# Patient Record
Sex: Male | Born: 1960 | Race: White | Hispanic: No | Marital: Single | Smoking: Former smoker
Health system: Southern US, Community
[De-identification: ages and names within clinical notes are randomized; demographics above are authoritative.]

## PROBLEM LIST (undated history)

## (undated) DIAGNOSIS — I10 Essential (primary) hypertension: Secondary | ICD-10-CM

## (undated) DIAGNOSIS — I499 Cardiac arrhythmia, unspecified: Secondary | ICD-10-CM

## (undated) HISTORY — PX: JOINT REPLACEMENT: SHX530

---

## 1998-12-14 ENCOUNTER — Emergency Department (HOSPITAL_COMMUNITY): Admission: EM | Admit: 1998-12-14 | Discharge: 1998-12-14 | Payer: Self-pay | Admitting: Emergency Medicine

## 2003-01-30 ENCOUNTER — Encounter: Admission: RE | Admit: 2003-01-30 | Discharge: 2003-01-30 | Payer: Self-pay | Admitting: Sports Medicine

## 2003-02-02 ENCOUNTER — Encounter: Payer: Self-pay | Admitting: Sports Medicine

## 2003-02-02 ENCOUNTER — Encounter: Admission: RE | Admit: 2003-02-02 | Discharge: 2003-02-02 | Payer: Self-pay | Admitting: Sports Medicine

## 2003-03-03 ENCOUNTER — Encounter: Admission: RE | Admit: 2003-03-03 | Discharge: 2003-03-03 | Payer: Self-pay | Admitting: Family Medicine

## 2008-08-12 ENCOUNTER — Encounter: Admission: RE | Admit: 2008-08-12 | Discharge: 2008-08-12 | Payer: Self-pay | Admitting: Family Medicine

## 2015-06-09 ENCOUNTER — Telehealth: Payer: Self-pay | Admitting: Hematology and Oncology

## 2015-06-09 NOTE — Telephone Encounter (Signed)
LT MESS FOR PT REGARDING NEW PT REFERRAL

## 2015-06-09 NOTE — Telephone Encounter (Signed)
Pt returned call and need to reschedule appt due to being out of town. Confirmed appt for 2/28 at 3:15pm

## 2015-06-10 ENCOUNTER — Ambulatory Visit: Payer: Self-pay | Admitting: Hematology and Oncology

## 2015-06-15 ENCOUNTER — Ambulatory Visit (HOSPITAL_BASED_OUTPATIENT_CLINIC_OR_DEPARTMENT_OTHER): Payer: 59 | Admitting: Hematology and Oncology

## 2015-06-15 ENCOUNTER — Encounter: Payer: Self-pay | Admitting: Hematology and Oncology

## 2015-06-15 VITALS — BP 141/82 | HR 84 | Temp 97.8°F | Resp 19 | Ht 73.0 in | Wt 212.2 lb

## 2015-06-15 DIAGNOSIS — R7989 Other specified abnormal findings of blood chemistry: Secondary | ICD-10-CM

## 2015-06-15 NOTE — Progress Notes (Addendum)
[facility] CONSULT NOTE  Referring physician: Dr. Collene Mares  CHIEF COMPLAINTS/PURPOSE OF CONSULTATION:  Newly diagnosed elevated ferritin  HISTORY OF PRESENTING ILLNESS:  Tommy Barnett 55 y.o. male is here because of recent diagnosis of elevated ferritin of 2038. Patient has had mildly elevated liver function tests that these going back to 2012 and was referred to see Dr. Collene Mares. Dr. Collene Mares had performed an extensive workup on him which showed elevation of ferritin of 2038 with his serum iron of 74 and a TIBC of 246 with iron saturation of 29%. 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 He was referred to [place] to discuss the results of this test and to plan further workup.  I reviewed her records extensively and collaborated the history with the patient.  MEDICAL HISTORY: back pain, hypertension, H. Pylori, GERD, internal hemorrhoids, elevated LFTs  SURGICAL HISTORY:left knee surgery  SOCIAL HISTORY:he is married he is accompanied by his wife today, denies any drug abuse, he has 2 children, is self-employed in a part owner of multiple businesses, he quit smoking in 2013 and smoked for 20 years, he does drink alcohol occasionally with food especially wine and occasional beer.  FAMILY HISTORY:father died of liver cancer, mother died of lung cancer, no history of hemochromatosis in the family  ALLERGIES:  has No Known Allergies.  MEDICATIONS:  Current Outpatient Prescriptions  Medication Sig Dispense Refill  . lisinopril (PRINIVIL,ZESTRIL) 10 MG tablet Take 10 mg by mouth.    . sildenafil (VIAGRA) 100 MG tablet Take 100 mg by mouth.     No current facility-administered medications for this visit.    REVIEW OF SYSTEMS:   Constitutional: Denies fevers, chills or abnormal night sweats Eyes: Denies blurriness of vision, double vision or watery eyes Ears, nose, mouth, throat, and face: Denies mucositis or sore  throat Respiratory: Denies cough, dyspnea or wheezes Cardiovascular: Denies palpitation, chest discomfort or lower extremity swelling Gastrointestinal:  Denies nausea, heartburn or change in bowel habits Skin: Denies abnormal skin rashes Lymphatics: Denies new lymphadenopathy or easy bruising Neurological:Denies numbness, tingling or new weaknesses Behavioral/Psych: Mood is stable, no new changes  All other systems were reviewed with the patient and are negative.  PHYSICAL EXAMINATION: ECOG PERFORMANCE STATUS: 0 - Asymptomatic  Filed Vitals:   06/15/15 1540  BP: 141/82  Pulse: 84  Temp: 97.8 F (36.6 C)  Resp: 19   Filed Weights   06/15/15 1540  Weight: 212 lb 3.2 oz (96.253 kg)    GENERAL:alert, no distress and comfortable SKIN: skin color, texture, turgor are normal, no rashes or significant lesions EYES: normal, conjunctiva are pink and non-injected, sclera clear OROPHARYNX:no exudate, no erythema and lips, buccal mucosa, and tongue normal  NECK: supple, thyroid normal size, non-tender, without nodularity LYMPH:  no palpable lymphadenopathy in the cervical, axillary or inguinal LUNGS: clear to auscultation and percussion with normal breathing effort HEART: regular rate & rhythm and no murmurs and no lower extremity edema ABDOMEN:abdomen soft, non-tender and normal bowel sounds Musculoskeletal:no cyanosis of digits and no clubbing  PSYCH: alert & oriented x 3 with fluent speech NEURO: no focal motor/sensory deficits  LABORATORY DATA:  I have reviewed the data from the blood work sent to me from Dr. Collene Mares  ASSESSMENT AND PLAN:  Elevated ferritin I discussed with the patient extensively regarding the causes of elevated ferritin. These include iron overload, liver disorders, inflammation or any other acute illness. It appears that prior to his elevated ferritin test  showing ferritin level of 2000, he had an acute diarrheal illness. He has since recovered fully from  that.  Lab review: I discussed with the patient that even though ferritin is elevated, the iron saturation is only 29%. This does not meet the diagnostic criteria for hemochromatosis. It mostly suggests that there is a secondary reason for his elevated ferritin. Additional testing was negative for hepatitis B and C and autoimmune hepatitis. Ceruloplasmin was also normal.  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 I discussed with them that heterozygosity does not usually cause hemochromatosis. The degree of penetrance of these mutations and causing clinical hemochromatosis is quite wary and usually has a low degree of penetrance. Meaning, patients have a low likelihood of developing symptoms of hemochromatosis with heterozygous mutations.  Recommendation and plan: 1. Recheck iron studies including ferritin 2. Recheck his liver function tests 3. If the tests continue to show abnormality, I recommend obtaining a liver ultrasound. If there is also not clear that we might have to obtain a liver MRI to document iron overload.  I discussed with the patient that other causes of elevated LFTs will need to be evaluated including evaluation to rule out fatty liver. Ultrasound of the liver can show evidence of fatty liver if there was any. I will call him with the results of his blood tests from tomorrow and we will decide on appropriate follow-up based on those results.  All questions were answered. The patient knows to call the clinic with any problems, questions or concerns.    Rulon Eisenmenger, MD 06/15/2015   Addendum: Review of his blood work done on 06/16/2015 revealed Serum iron 188, iron saturation 77%, ferritin 1683 AST 62, ALT 103 I called and discussed the results with the patient. Because of the high iron saturation, I strongly believe that this is truly hemochromatosis and I would like to get a liver MRI to evaluate the iron  overload and to see if our radiology can quantify his iron levels based on the MRI result. He will start phlebotomy starting this Friday. I will see him back next week to discuss the results of MRI and to plan further phlebotomies. I suspect that he might need 3250 phlebotomies before reducing his iron levels to a goal ferritin of 50-100 After that is complete then he will go on a maintenance phlebotomy schedule once every 3 months.  I discussed the case with Dr. Collene Mares.

## 2015-06-15 NOTE — Assessment & Plan Note (Addendum)
I discussed with the patient extensively regarding the causes of elevated ferritin. These include iron overload, liver disorders, inflammation or any other acute illness. It appears that prior to his elevated ferritin test showing ferritin level of 2000, he had an acute diarrheal illness. He has since recovered fully from that.  Lab review: I discussed with the patient that even though ferritin is elevated, the iron saturation is only 29%. This does not meet the diagnostic criteria for hemochromatosis. It mostly suggests that there is a secondary reason for his elevated ferritin. Additional testing was negative for hepatitis B and C and autoimmune hepatitis. Ceruloplasmin was also normal.  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 I discussed with them that heterozygosity does not usually cause hemochromatosis.  Recommendation and plan: 1. Recheck iron studies including ferritin 2. Recheck his liver function tests 3. If the tests continue to show abnormality, I recommend obtaining a liver ultrasound. If there is also not clear that we might have to obtain a liver MRI to document iron overload.  I discussed with the patient that other causes of elevated LFTs will need to be evaluated including evaluation to rule out fatty liver. Ultrasound of the liver congenitally show evidence of fatty liver if there was any.

## 2015-06-16 ENCOUNTER — Telehealth: Payer: Self-pay | Admitting: *Deleted

## 2015-06-16 ENCOUNTER — Other Ambulatory Visit (HOSPITAL_BASED_OUTPATIENT_CLINIC_OR_DEPARTMENT_OTHER): Payer: 59

## 2015-06-16 ENCOUNTER — Other Ambulatory Visit: Payer: Self-pay | Admitting: Hematology and Oncology

## 2015-06-16 ENCOUNTER — Encounter: Payer: Self-pay | Admitting: *Deleted

## 2015-06-16 DIAGNOSIS — R7989 Other specified abnormal findings of blood chemistry: Secondary | ICD-10-CM

## 2015-06-16 LAB — COMPREHENSIVE METABOLIC PANEL
ALT: 103 U/L — AB (ref 0–55)
ANION GAP: 9 meq/L (ref 3–11)
AST: 62 U/L — AB (ref 5–34)
Albumin: 4.4 g/dL (ref 3.5–5.0)
Alkaline Phosphatase: 73 U/L (ref 40–150)
BILIRUBIN TOTAL: 0.99 mg/dL (ref 0.20–1.20)
BUN: 13 mg/dL (ref 7.0–26.0)
CHLORIDE: 106 meq/L (ref 98–109)
CO2: 24 meq/L (ref 22–29)
CREATININE: 1.1 mg/dL (ref 0.7–1.3)
Calcium: 9.4 mg/dL (ref 8.4–10.4)
EGFR: 79 mL/min/{1.73_m2} — ABNORMAL LOW (ref >90)
GLUCOSE: 131 mg/dL (ref 70–140)
Potassium: 4.5 mEq/L (ref 3.5–5.1)
SODIUM: 139 meq/L (ref 136–145)
TOTAL PROTEIN: 7.2 g/dL (ref 6.4–8.3)

## 2015-06-16 LAB — IRON AND TIBC
%SAT: 77 % — AB (ref 20–55)
Iron: 188 ug/dL — ABNORMAL HIGH (ref 42–163)
TIBC: 246 ug/dL (ref 202–409)
UIBC: 58 ug/dL — ABNORMAL LOW (ref 117–376)

## 2015-06-16 LAB — CBC WITH DIFFERENTIAL/PLATELET
BASO%: 0.6 % (ref 0.0–2.0)
BASOS ABS: 0 10*3/uL (ref 0.0–0.1)
EOS ABS: 0.2 10*3/uL (ref 0.0–0.5)
EOS%: 2.8 % (ref 0.0–7.0)
HEMATOCRIT: 46.6 % (ref 38.4–49.9)
HEMOGLOBIN: 16 g/dL (ref 13.0–17.1)
LYMPH#: 2.1 10*3/uL (ref 0.9–3.3)
LYMPH%: 38.1 % (ref 14.0–49.0)
MCH: 33.4 pg (ref 27.2–33.4)
MCHC: 34.3 g/dL (ref 32.0–36.0)
MCV: 97.5 fL (ref 79.3–98.0)
MONO#: 0.5 10*3/uL (ref 0.1–0.9)
MONO%: 9.2 % (ref 0.0–14.0)
NEUT#: 2.7 10*3/uL (ref 1.5–6.5)
NEUT%: 49.3 % (ref 39.0–75.0)
Platelets: 164 10*3/uL (ref 140–400)
RBC: 4.78 10*6/uL (ref 4.20–5.82)
RDW: 12.5 % (ref 11.0–14.6)
WBC: 5.6 10*3/uL (ref 4.0–10.3)

## 2015-06-16 LAB — LACTATE DEHYDROGENASE: LDH: 219 U/L (ref 125–245)

## 2015-06-16 LAB — FERRITIN: Ferritin: 1683 ng/ml — ABNORMAL HIGH (ref 22–316)

## 2015-06-16 NOTE — Telephone Encounter (Signed)
FYI "I came in this morning for lab.  Dr. Lindi Adie is to call results.  How is my ferritin, liver enzymes.  What are the results?"  Reported there are some elevations in levels he inquired about and some other results are still pending.  Provider will contact as indicated after review.  No further questions.

## 2015-06-16 NOTE — Telephone Encounter (Signed)
Dr. Lindi Adie will call results.

## 2015-06-16 NOTE — Progress Notes (Signed)
Office notes received from [facility] by Dr. Lindi Adie, sent to scan.

## 2015-06-17 ENCOUNTER — Telehealth: Payer: Self-pay | Admitting: Hematology and Oncology

## 2015-06-17 LAB — C-REACTIVE PROTEIN: CRP: 2.6 mg/L (ref 0.0–4.9)

## 2015-06-17 NOTE — Telephone Encounter (Signed)
Made appt for phlebotomy on 3/8. Central scheduling will contact patient to schedule MRI

## 2015-06-18 ENCOUNTER — Other Ambulatory Visit: Payer: Self-pay | Admitting: *Deleted

## 2015-06-18 ENCOUNTER — Telehealth: Payer: Self-pay

## 2015-06-18 ENCOUNTER — Ambulatory Visit (HOSPITAL_BASED_OUTPATIENT_CLINIC_OR_DEPARTMENT_OTHER): Payer: 59

## 2015-06-18 ENCOUNTER — Telehealth: Payer: Self-pay | Admitting: Hematology and Oncology

## 2015-06-18 VITALS — BP 118/88 | HR 71 | Temp 98.6°F | Resp 18

## 2015-06-18 DIAGNOSIS — R7989 Other specified abnormal findings of blood chemistry: Secondary | ICD-10-CM

## 2015-06-18 NOTE — Telephone Encounter (Signed)
Patient called regarding the MRI that Dr. Lindi Adie wanted him to have.  Patient states that he has not heard from schedulers and would like to schedule this test.  Writer called Central Scheduling and spoke with [place] who states that they have 5 days to get in touch with the patient and schedule the scan.  However patient will be called and scheduled today per [place].

## 2015-06-18 NOTE — Patient Instructions (Signed)

## 2015-06-18 NOTE — Progress Notes (Signed)
Pt arrived to infusion room for first phlebotomy.  Pt denies any complaints at time of arrival.  Pt reports eating and drinking plenty today.  He does not wish to have anything to eat or drink prior to starting procedure.  Phlebotomy performed per order via right AC without complications.  Pt observed for 30 minutes post phlebotomy.  Vitals obtained and remain stable as charted.  Pt discharged ambulatory with no further questions or concerns.

## 2015-06-18 NOTE — Telephone Encounter (Signed)
s.w. pt and advised on March appt....pt ok and aware °

## 2015-06-23 ENCOUNTER — Ambulatory Visit (HOSPITAL_COMMUNITY)
Admission: RE | Admit: 2015-06-23 | Discharge: 2015-06-23 | Disposition: A | Discharge status: Home / Self Care | Payer: 59 | Source: Ambulatory Visit | Attending: Hematology and Oncology | Admitting: Hematology and Oncology

## 2015-06-23 ENCOUNTER — Ambulatory Visit (HOSPITAL_BASED_OUTPATIENT_CLINIC_OR_DEPARTMENT_OTHER): Payer: 59

## 2015-06-23 VITALS — BP 130/70 | HR 48 | Temp 98.2°F | Resp 18

## 2015-06-23 DIAGNOSIS — R7989 Other specified abnormal findings of blood chemistry: Secondary | ICD-10-CM | POA: Diagnosis not present

## 2015-06-23 DIAGNOSIS — K760 Fatty (change of) liver, not elsewhere classified: Secondary | ICD-10-CM | POA: Diagnosis not present

## 2015-06-23 MED ORDER — GADOBENATE DIMEGLUMINE 529 MG/ML IV SOLN
20.0000 mL | Freq: Once | INTRAVENOUS | Status: AC | PRN
  Administered 2015-06-23: 19 mL via INTRAVENOUS

## 2015-06-23 NOTE — Patient Instructions (Signed)

## 2015-06-23 NOTE — Progress Notes (Signed)
Pre-phlebotomy VS obtained. Patient had no complaints. States he had no complications after last phlebotomy. Right AC cannulated without difficulty. Removed 535 grams. Tolerated procedure well. Patient did not want anything to eat. Drinking water. Post-phlebotomy VS stable.

## 2015-06-29 ENCOUNTER — Ambulatory Visit (HOSPITAL_BASED_OUTPATIENT_CLINIC_OR_DEPARTMENT_OTHER): Payer: 59

## 2015-06-29 ENCOUNTER — Ambulatory Visit (HOSPITAL_BASED_OUTPATIENT_CLINIC_OR_DEPARTMENT_OTHER): Payer: 59 | Admitting: Hematology and Oncology

## 2015-06-29 ENCOUNTER — Telehealth: Payer: Self-pay | Admitting: Hematology and Oncology

## 2015-06-29 ENCOUNTER — Encounter: Payer: Self-pay | Admitting: *Deleted

## 2015-06-29 VITALS — BP 118/77 | HR 69 | Temp 98.2°F | Resp 18

## 2015-06-29 DIAGNOSIS — R7989 Other specified abnormal findings of blood chemistry: Secondary | ICD-10-CM

## 2015-06-29 LAB — CBC WITH DIFFERENTIAL/PLATELET
BASO%: 0.9 % (ref 0.0–2.0)
Basophils Absolute: 0 10*3/uL (ref 0.0–0.1)
EOS%: 2.2 % (ref 0.0–7.0)
Eosinophils Absolute: 0.1 10*3/uL (ref 0.0–0.5)
HEMATOCRIT: 40.2 % (ref 38.4–49.9)
HEMOGLOBIN: 13.6 g/dL (ref 13.0–17.1)
LYMPH#: 1.6 10*3/uL (ref 0.9–3.3)
LYMPH%: 30.1 % (ref 14.0–49.0)
MCH: 33.3 pg (ref 27.2–33.4)
MCHC: 33.9 g/dL (ref 32.0–36.0)
MCV: 98.1 fL — ABNORMAL HIGH (ref 79.3–98.0)
MONO#: 0.4 10*3/uL (ref 0.1–0.9)
MONO%: 7.5 % (ref 0.0–14.0)
NEUT#: 3.2 10*3/uL (ref 1.5–6.5)
NEUT%: 59.3 % (ref 39.0–75.0)
Platelets: 150 10*3/uL (ref 140–400)
RBC: 4.09 10*6/uL — ABNORMAL LOW (ref 4.20–5.82)
RDW: 13.1 % (ref 11.0–14.6)
WBC: 5.3 10*3/uL (ref 4.0–10.3)

## 2015-06-29 LAB — COMPREHENSIVE METABOLIC PANEL
ALBUMIN: 4 g/dL (ref 3.5–5.0)
ALK PHOS: 65 U/L (ref 40–150)
ALT: 86 U/L — ABNORMAL HIGH (ref 0–55)
ANION GAP: 7 meq/L (ref 3–11)
AST: 46 U/L — ABNORMAL HIGH (ref 5–34)
BILIRUBIN TOTAL: 0.46 mg/dL (ref 0.20–1.20)
BUN: 10.7 mg/dL (ref 7.0–26.0)
CO2: 24 mEq/L (ref 22–29)
Calcium: 9.1 mg/dL (ref 8.4–10.4)
Chloride: 108 mEq/L (ref 98–109)
Creatinine: 0.9 mg/dL (ref 0.7–1.3)
Glucose: 128 mg/dl (ref 70–140)
Potassium: 4.2 mEq/L (ref 3.5–5.1)
Sodium: 139 mEq/L (ref 136–145)
TOTAL PROTEIN: 6.4 g/dL (ref 6.4–8.3)

## 2015-06-29 LAB — IRON AND TIBC
%SAT: 21 % (ref 20–55)
Iron: 52 ug/dL (ref 42–163)
TIBC: 246 ug/dL (ref 202–409)
UIBC: 195 ug/dL (ref 117–376)

## 2015-06-29 LAB — FERRITIN

## 2015-06-29 NOTE — Assessment & Plan Note (Signed)
06/16/2015 revealed Serum iron 188, iron saturation 77%, ferritin 1683, AST 62, ALT 103 MRI Liver 06/24/2015: No MRI evidence of iron deposition within the liver, spleen, marrow. Diffuse hepatic steatosis Current treatment: Phlebotomy with target ferritin 50-100 and iron saturation below 45% started 06/18/2015  Plan: Twice a week phlebotomy for one month and reassessment of iron studies Recent colonoscopy showing a terminal ilium lesion which was biopsied. Pathology is pending. Previous liver MRI did not reveal any abnormalities in the small intestines.  Return to clinic in 4 weeks for follow-up with labs ahead of time

## 2015-06-29 NOTE — Progress Notes (Signed)
Received colonoscopy report from the [facility], reviewed by Dr. Lindi Adie, sent to scan.

## 2015-06-29 NOTE — Progress Notes (Signed)
Patient Care Team: [place] as PCP - General (Family Medicine)  DIAGNOSIS: hereditary hemochromatosis CURRENT TREATMENT: phlebotomy started 06/18/2015 CHIEF COMPLIANT: follow-up on phlebotomy  INTERVAL HISTORY: Tommy Barnett is a 55 year old with above-mentioned history of hereditary hemochromatosis was started on phlebotomy on 06/18/2015. He had 2 phlebotomies so far. He is here today to discuss the results of the liver MRI as well as the plan of care.  REVIEW OF SYSTEMS:   Constitutional: Denies fevers, chills or abnormal weight loss Eyes: Denies blurriness of vision Ears, nose, mouth, throat, and face: Denies mucositis or sore throat Respiratory: Denies cough, dyspnea or wheezes Cardiovascular: Denies palpitation, chest discomfort Gastrointestinal:  Denies nausea, heartburn or change in bowel habits Skin: Denies abnormal skin rashes Lymphatics: Denies new lymphadenopathy or easy bruising Neurological:Denies numbness, tingling or new weaknesses Behavioral/Psych: Mood is stable, no new changes  Extremities: No lower extremity edema  All other systems were reviewed with the patient and are negative.  I have reviewed the past medical history, past surgical history, social history and family history with the patient and they are unchanged from previous note.  ALLERGIES:  has No Known Allergies.  MEDICATIONS:  Current Outpatient Prescriptions  Medication Sig Dispense Refill  . lisinopril (PRINIVIL,ZESTRIL) 10 MG tablet Take 10 mg by mouth.    . sildenafil (VIAGRA) 100 MG tablet Take 100 mg by mouth.     No current facility-administered medications for this visit.    PHYSICAL EXAMINATION: ECOG PERFORMANCE STATUS: 0 - Asymptomatic  Filed Vitals:   06/29/15 0839  BP: 138/75  Pulse: 68  Temp: 97.9 F (36.6 C)  Resp: 18   Filed Weights   06/29/15 0839  Weight: 212 lb (96.163 kg)    GENERAL:alert, no distress and comfortable SKIN:  skin color, texture, turgor are normal, no rashes or significant lesions EYES: normal, Conjunctiva are pink and non-injected, sclera clear OROPHARYNX:no exudate, no erythema and lips, buccal mucosa, and tongue normal  NECK: supple, thyroid normal size, non-tender, without nodularity LYMPH:  no palpable lymphadenopathy in the cervical, axillary or inguinal LUNGS: clear to auscultation and percussion with normal breathing effort HEART: regular rate & rhythm and no murmurs and no lower extremity edema ABDOMEN:abdomen soft, non-tender and normal bowel sounds MUSCULOSKELETAL:no cyanosis of digits and no clubbing  NEURO: alert & oriented x 3 with fluent speech, no focal motor/sensory deficits EXTREMITIES: No lower extremity edema  LABORATORY DATA:  I have reviewed the data as listed   Chemistry      Component Value Date/Time   NA 139 06/16/2015 0907   K 4.5 06/16/2015 0907   CO2 24 06/16/2015 0907   BUN 13.0 06/16/2015 0907   CREATININE 1.1 06/16/2015 0907      Component Value Date/Time   CALCIUM 9.4 06/16/2015 0907   ALKPHOS 73 06/16/2015 0907   AST 62* 06/16/2015 0907   ALT 103* 06/16/2015 0907   BILITOT 0.99 06/16/2015 0907      Lab Results  Component Value Date   WBC 5.6 06/16/2015   HGB 16.0 06/16/2015   HCT 46.6 06/16/2015   MCV 97.5 06/16/2015   PLT 164 06/16/2015   NEUTROABS 2.7 06/16/2015   ASSESSMENT & PLAN:  Hemochromatosis, hereditary ([place]) 06/16/2015 revealed Serum iron 188, iron saturation 77%, ferritin 1683, AST 62, ALT 103 MRI Liver 06/24/2015: No MRI evidence of iron deposition within the liver, spleen, marrow. Diffuse hepatic steatosis Current treatment: Phlebotomy with target ferritin 50-100 and iron saturation below 45% started 06/18/2015  Plan: Once a week phlebotomy for one month and reassessment of iron studies weekly  Recent colonoscopy showing a terminal ilium lesion which was biopsied. Pathology is pending. Previous liver MRI did not reveal any  abnormalities in the small intestines. We will perform blood work today to evaluate his iron studies.   Patient plans to go to [facility] for a second opinion. We will see him on April 3 to review the recommendations from [facility].  Orders Placed This Encounter  Procedures  . CBC with Differential    Standing Status: Standing     Number of Occurrences: 20     Standing Expiration Date: 06/28/2016  . Iron and TIBC    Standing Status: Standing     Number of Occurrences: 20     Standing Expiration Date: 06/28/2016  . Ferritin    Standing Status: Standing     Number of Occurrences: 20     Standing Expiration Date: 06/28/2016   The patient has a good understanding of the overall plan. he agrees with it. he will call with any problems that may develop before the next visit here.   Rulon Eisenmenger, MD 06/29/2015

## 2015-06-29 NOTE — Patient Instructions (Signed)

## 2015-06-29 NOTE — Telephone Encounter (Signed)
appt made and avs printed °

## 2015-06-29 NOTE — Progress Notes (Signed)
58m removed from patient right ([facility] with 16 gauge phlebotomy kit. Patient tolerated well. 30 minutes observation. Vitals signs taken and patient discharged in stable condition.

## 2015-07-06 ENCOUNTER — Other Ambulatory Visit (HOSPITAL_BASED_OUTPATIENT_CLINIC_OR_DEPARTMENT_OTHER): Payer: 59

## 2015-07-06 ENCOUNTER — Ambulatory Visit (HOSPITAL_BASED_OUTPATIENT_CLINIC_OR_DEPARTMENT_OTHER): Payer: 59

## 2015-07-06 VITALS — BP 118/79 | HR 83 | Temp 98.2°F | Resp 18

## 2015-07-06 DIAGNOSIS — R7989 Other specified abnormal findings of blood chemistry: Secondary | ICD-10-CM

## 2015-07-06 LAB — CBC WITH DIFFERENTIAL/PLATELET
BASO%: 1 % (ref 0.0–2.0)
Basophils Absolute: 0.1 10*3/uL (ref 0.0–0.1)
EOS ABS: 0.1 10*3/uL (ref 0.0–0.5)
EOS%: 2.5 % (ref 0.0–7.0)
HCT: 41 % (ref 38.4–49.9)
HEMOGLOBIN: 14.1 g/dL (ref 13.0–17.1)
LYMPH#: 2 10*3/uL (ref 0.9–3.3)
LYMPH%: 38 % (ref 14.0–49.0)
MCH: 34.3 pg — ABNORMAL HIGH (ref 27.2–33.4)
MCHC: 34.4 g/dL (ref 32.0–36.0)
MCV: 100 fL — AB (ref 79.3–98.0)
MONO#: 0.4 10*3/uL (ref 0.1–0.9)
MONO%: 6.9 % (ref 0.0–14.0)
NEUT%: 51.6 % (ref 39.0–75.0)
NEUTROS ABS: 2.7 10*3/uL (ref 1.5–6.5)
PLATELETS: 183 10*3/uL (ref 140–400)
RBC: 4.1 10*6/uL — ABNORMAL LOW (ref 4.20–5.82)
RDW: 13.4 % (ref 11.0–14.6)
WBC: 5.1 10*3/uL (ref 4.0–10.3)

## 2015-07-06 LAB — FERRITIN: FERRITIN: 769 ng/mL — AB (ref 22–316)

## 2015-07-06 LAB — COMPREHENSIVE METABOLIC PANEL
ALT: 71 U/L — ABNORMAL HIGH (ref 0–55)
ANION GAP: 7 meq/L (ref 3–11)
AST: 40 U/L — AB (ref 5–34)
Albumin: 3.9 g/dL (ref 3.5–5.0)
Alkaline Phosphatase: 64 U/L (ref 40–150)
BUN: 11.5 mg/dL (ref 7.0–26.0)
CALCIUM: 8.9 mg/dL (ref 8.4–10.4)
CHLORIDE: 106 meq/L (ref 98–109)
CO2: 26 mEq/L (ref 22–29)
Creatinine: 0.9 mg/dL (ref 0.7–1.3)
Glucose: 194 mg/dl — ABNORMAL HIGH (ref 70–140)
POTASSIUM: 4.4 meq/L (ref 3.5–5.1)
SODIUM: 139 meq/L (ref 136–145)
Total Bilirubin: 0.63 mg/dL (ref 0.20–1.20)
Total Protein: 6.4 g/dL (ref 6.4–8.3)

## 2015-07-06 LAB — IRON AND TIBC
%SAT: 48 % (ref 20–55)
Iron: 124 ug/dL (ref 42–163)
TIBC: 260 ug/dL (ref 202–409)
UIBC: 136 ug/dL (ref 117–376)

## 2015-07-07 ENCOUNTER — Telehealth: Payer: Self-pay

## 2015-07-07 ENCOUNTER — Encounter: Payer: Self-pay | Admitting: Hematology and Oncology

## 2015-07-07 NOTE — Telephone Encounter (Signed)
Returned call to pt with lab results.  Pt is requesting lab results be released to MyChart.  Advised pt I would let Dr. Lindi Adie know.  Pt voiced understanding.

## 2015-07-08 ENCOUNTER — Telehealth: Payer: Self-pay | Admitting: *Deleted

## 2015-07-08 NOTE — Telephone Encounter (Signed)
See 07-08-2015 phone note.  Patient called with this request.  This nurse printed results for patient.  He is to report to [facility]  Monday 07-12-2015 is why he's asked for results to take with him.

## 2015-07-08 NOTE — Telephone Encounter (Signed)
"  I had lab 07-06-2015.  Was told results would be on MyChart that night and they're still not there.  Can someone release these to [place]?"  Advised labs were reviewed by provider 07-06-2015 ar 5:11 pm.  Will automatically be released 07-12-2015 at [place].  "I have to go to the [facility] Monday and need these results.  Denies having [place] fax number.  Copy of results provided for lobby pick up today.

## 2015-07-12 ENCOUNTER — Ambulatory Visit: Payer: Self-pay | Admitting: General Surgery

## 2015-07-12 NOTE — H&P (Signed)
Tommy Barnett. Mutch 07/12/2015 8:36 AM Location: [facility] Patient #: [id number] DOB: [birth year] Married / Language: English / Race: White Male  History of Present Illness Tommy Hollingshead MD; 07/12/2015 9:11 AM) Patient words: carcinoid of terminal ileum.  The patient is a 55 year old male.   Note:He is referred by Dr. 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 This was discovered on a screening colonoscopy done 06/28/15. A mass was noted in the terminal ileum area. Biopsy was performed with the above pathology. Some other polyps removed that were benign. He has no carcinoid syndrome type symptoms. No family history of colorectal cancer. He has a diagnosis of hemachromatosis and is going to the [facility] in [place] for a second opinion on this. He leaves later today and is appointment this tomorrow. His wife is here with him.  Other Problems Jeralyn Ruths, CMA; 07/12/2015 8:36 AM) Back Pain Gastric Ulcer Gastroesophageal Reflux Disease High blood pressure  Past Surgical History Jeralyn Ruths, CMA; 07/12/2015 8:36 AM) Colon Polyp Removal - Colonoscopy Knee Surgery Left.  Diagnostic Studies History Jeralyn Ruths, [place]; 07/12/2015 8:36 AM) Colonoscopy within last year  Allergies Jeralyn Ruths, [place]; 07/12/2015 8:37 AM) No Known Drug Allergies 07/12/2015  Medication History Jeralyn Ruths, CMA; 07/12/2015 8:37 AM) Lisinopril (10MG  Tablet, Oral) Active. Viagra (100MG  Tablet, Oral) Active. Medications Reconciled  Social History Jeralyn Ruths, CMA; 07/12/2015 8:36 AM) Alcohol use Moderate alcohol use. Caffeine use Carbonated beverages, Tea. No drug use Tobacco use Former smoker.  Family History Jeralyn Ruths, [place]; 07/12/2015 8:36 AM) Alcohol Abuse Brother, Father. Cancer Father, Mother. Diabetes Mellitus Brother, Father. Hypertension Father.     Review of Systems Jearld Fenton Morris  CMA; 07/12/2015 8:36 AM) General Not Present- Appetite Loss, Chills, Fatigue, Fever, Night Sweats, Weight Gain and Weight Loss. Skin Not Present- Change in Wart/Mole, Dryness, Hives, Jaundice, New Lesions, Non-Healing Wounds, Rash and Ulcer. HEENT Not Present- Earache, Hearing Loss, Hoarseness, Nose Bleed, Oral Ulcers, Ringing in the Ears, Seasonal Allergies, Sinus Pain, Sore Throat, Visual Disturbances, Wears glasses/contact lenses and Yellow Eyes. Respiratory Not Present- Bloody sputum, Chronic Cough, Difficulty Breathing, Snoring and Wheezing. Breast Not Present- Breast Mass, Breast Pain, Nipple Discharge and Skin Changes. Cardiovascular Not Present- Chest Pain, Difficulty Breathing Lying Down, Leg Cramps, Palpitations, Rapid Heart Rate, Shortness of Breath and Swelling of Extremities. Gastrointestinal Not Present- Abdominal Pain, Bloating, Bloody Stool, Change in Bowel Habits, Chronic diarrhea, Constipation, Difficulty Swallowing, Excessive gas, Gets full quickly at meals, Hemorrhoids, Indigestion, Nausea, Rectal Pain and Vomiting. Musculoskeletal Not Present- Back Pain, Joint Pain, Joint Stiffness, Muscle Pain, Muscle Weakness and Swelling of Extremities. Neurological Not Present- Decreased Memory, Fainting, Headaches, Numbness, Seizures, Tingling, Tremor, Trouble walking and Weakness. Psychiatric Not Present- Anxiety, Bipolar, Change in Sleep Pattern, Depression, Fearful and Frequent crying. Endocrine Not Present- Cold Intolerance, Excessive Hunger, Hair Changes, Heat Intolerance, Hot flashes and New Diabetes. Hematology Not Present- Easy Bruising, Excessive bleeding, Gland problems, HIV and Persistent Infections.  Vitals Jearld Fenton Morris CMA; 07/12/2015 8:39 AM) 07/12/2015 8:37 AM Weight: 210 lb Height: 73in Body Surface Area: 2.2 m Body Mass Index: 27.71 kg/m  Temp.: 98.31F(Oral)  Pulse: 62 (Regular)  BP: 124/82 (Sitting, Left Arm, Standard)      Physical Exam Tommy Hollingshead MD; 07/12/2015 9:19 AM)  The physical exam findings are as follows: Note:General: WDWN in NAD. Pleasant and cooperative.  HEENT: [place]/AT, no facial masses  EYES: Wears glasses, EOMI, no icterus  NECK: Supple, no obvious mass.  CV: RRR.  CHEST: Breath sounds equal and clear. Respirations nonlabored.  ABDOMEN: Soft, nontender, nondistended, no masses, no organomegaly, active bowel sounds, no scars, no hernias.  MUSCULOSKELETAL: FROM, good muscle tone, no edema  LYMPHATIC: No palpable cervical, supraclavicular.  SKIN: No jaundice.  NEUROLOGIC: Alert and oriented, answers questions appropriately.  PSYCHIATRIC: Normal mood, affect , and behavior.    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 I recommended resection of the distal ileum and part of the descending colon by way of a laparoscopic-assisted technique. He and his wife are in agreement with this.  Plan: Laparoscopic-assisted resection of terminal ileum and part of ascending colon. I have explained the procedure and risks of the operation. Risks include but are not limited to bleeding, infection, wound problems, anesthesia, anastomotic leak, need for colostomy, need for reoperative surgery, injury to intraabominal organs (such as intestine, spleen, kidney, bladder, ureter, etc.), ileus, irregular bowel habits. He seems to understand and agrees with the plan. Bowel prep instructions were given to him.  Jackolyn Confer, MD

## 2015-07-18 NOTE — Assessment & Plan Note (Signed)
06/16/2015 revealed Serum iron 188, iron saturation 77%, ferritin 1683, AST 62, ALT 103 MRI Liver 06/24/2015: No MRI evidence of iron deposition within the liver, spleen, marrow. Diffuse hepatic steatosis Current treatment: Phlebotomy with target ferritin 50-100 and iron saturation below 45% started 06/18/2015  Plan: Once a week phlebotomy for one month and reassessment of iron studies weekly  Recent colonoscopy showing a terminal ilium lesion which was biopsied. Pathology is Carcinoid tumor. He will need resection. We will perform blood work today to evaluate his iron studies.  Patient plans to go to [facility] for a second opinion. We will see him on April 3 to review the recommendations from [facility].

## 2015-07-19 ENCOUNTER — Ambulatory Visit (HOSPITAL_BASED_OUTPATIENT_CLINIC_OR_DEPARTMENT_OTHER): Payer: 59

## 2015-07-19 ENCOUNTER — Other Ambulatory Visit (HOSPITAL_BASED_OUTPATIENT_CLINIC_OR_DEPARTMENT_OTHER): Payer: 59

## 2015-07-19 ENCOUNTER — Encounter: Payer: Self-pay | Admitting: Hematology and Oncology

## 2015-07-19 ENCOUNTER — Ambulatory Visit (HOSPITAL_BASED_OUTPATIENT_CLINIC_OR_DEPARTMENT_OTHER): Payer: 59 | Admitting: Hematology and Oncology

## 2015-07-19 ENCOUNTER — Telehealth: Payer: Self-pay | Admitting: Hematology and Oncology

## 2015-07-19 VITALS — BP 118/85 | HR 74 | Temp 98.7°F | Resp 18

## 2015-07-19 DIAGNOSIS — R739 Hyperglycemia, unspecified: Secondary | ICD-10-CM

## 2015-07-19 DIAGNOSIS — C7A012 Malignant carcinoid tumor of the ileum: Secondary | ICD-10-CM

## 2015-07-19 DIAGNOSIS — R7989 Other specified abnormal findings of blood chemistry: Secondary | ICD-10-CM

## 2015-07-19 LAB — CBC WITH DIFFERENTIAL/PLATELET
BASO%: 0.8 % (ref 0.0–2.0)
BASOS ABS: 0 10*3/uL (ref 0.0–0.1)
EOS%: 2.9 % (ref 0.0–7.0)
Eosinophils Absolute: 0.1 10*3/uL (ref 0.0–0.5)
HEMATOCRIT: 42.9 % (ref 38.4–49.9)
HEMOGLOBIN: 14.7 g/dL (ref 13.0–17.1)
LYMPH#: 1.7 10*3/uL (ref 0.9–3.3)
LYMPH%: 41 % (ref 14.0–49.0)
MCH: 34.3 pg — ABNORMAL HIGH (ref 27.2–33.4)
MCHC: 34.3 g/dL (ref 32.0–36.0)
MCV: 99.8 fL — ABNORMAL HIGH (ref 79.3–98.0)
MONO#: 0.5 10*3/uL (ref 0.1–0.9)
MONO%: 11.9 % (ref 0.0–14.0)
NEUT#: 1.8 10*3/uL (ref 1.5–6.5)
NEUT%: 43.4 % (ref 39.0–75.0)
Platelets: 156 10*3/uL (ref 140–400)
RBC: 4.3 10*6/uL (ref 4.20–5.82)
RDW: 13.1 % (ref 11.0–14.6)
WBC: 4.3 10*3/uL (ref 4.0–10.3)

## 2015-07-19 LAB — FERRITIN: Ferritin: 596 ng/ml — ABNORMAL HIGH (ref 22–316)

## 2015-07-19 LAB — COMPREHENSIVE METABOLIC PANEL
ALT: 70 U/L — ABNORMAL HIGH (ref 0–55)
AST: 42 U/L — AB (ref 5–34)
Albumin: 4.2 g/dL (ref 3.5–5.0)
Alkaline Phosphatase: 64 U/L (ref 40–150)
Anion Gap: 9 mEq/L (ref 3–11)
BUN: 14.3 mg/dL (ref 7.0–26.0)
CALCIUM: 9.4 mg/dL (ref 8.4–10.4)
CHLORIDE: 104 meq/L (ref 98–109)
CO2: 24 mEq/L (ref 22–29)
Creatinine: 1 mg/dL (ref 0.7–1.3)
EGFR: 80 mL/min/{1.73_m2} — ABNORMAL LOW (ref >90)
Glucose: 124 mg/dl (ref 70–140)
POTASSIUM: 4.4 meq/L (ref 3.5–5.1)
Sodium: 138 mEq/L (ref 136–145)
Total Bilirubin: 0.76 mg/dL (ref 0.20–1.20)
Total Protein: 6.8 g/dL (ref 6.4–8.3)

## 2015-07-19 LAB — IRON AND TIBC
%SAT: 34 % (ref 20–55)
Iron: 98 ug/dL (ref 42–163)
TIBC: 285 ug/dL (ref 202–409)
UIBC: 187 ug/dL (ref 117–376)

## 2015-07-19 NOTE — Patient Instructions (Signed)

## 2015-07-19 NOTE — Progress Notes (Signed)
Unable to get in to exam room prior to MD.  No assessment performed.  

## 2015-07-19 NOTE — Progress Notes (Signed)
Phlebotomy performed.  265 removed from rt [facility].  247 removed from lt AC.  Time:  [id number].  Pt tolerated well.  Pt ate p/t procedure and is having something to drink now.   Pt stayed only 49minutes for post observation.

## 2015-07-19 NOTE — Telephone Encounter (Signed)
appt made and avs printed °

## 2015-07-19 NOTE — Progress Notes (Signed)
Patient Care Team: [place] as PCP - General (Family Medicine)  DIAGNOSIS: hereditary hemochromatosis. Current treatment: Phlebotomy  CHIEF COMPLIANT: follow-up after visit to [facility] for second opinion  INTERVAL HISTORY: Tommy Barnett is a [id number] with above-mentioned history of hereditary hemochromatosis who is here for follow-up after he came back from [facility]. They have done extensive testing including a liver MRI which revealed a 1-2 fibrosis with evidence of iron overload. The recommended phlebotomy to keep ferritin below 50. He came in today for recheck of his blood work and for phlebotomy.they also instructed him to lose weight because of fatty liver.  REVIEW OF SYSTEMS:   Constitutional: Denies fevers, chills or abnormal weight loss Eyes: Denies blurriness of vision Ears, nose, mouth, throat, and face: Denies mucositis or sore throat Respiratory: Denies cough, dyspnea or wheezes Cardiovascular: Denies palpitation, chest discomfort Gastrointestinal:  Denies nausea, heartburn or change in bowel habits Skin: Denies abnormal skin rashes Lymphatics: Denies new lymphadenopathy or easy bruising Neurological:Denies numbness, tingling or new weaknesses Behavioral/Psych: Mood is stable, no new changes  Extremities: No lower extremity edema  All other systems were reviewed with the patient and are negative.  I have reviewed the past medical history, past surgical history, social history and family history with the patient and they are unchanged from previous note.  ALLERGIES:  has No Known Allergies.  MEDICATIONS:  Current Outpatient Prescriptions  Medication Sig Dispense Refill  . lisinopril (PRINIVIL,ZESTRIL) 10 MG tablet Take 10 mg by mouth.    . sildenafil (VIAGRA) 100 MG tablet Take 100 mg by mouth.     No current facility-administered medications for this visit.    PHYSICAL EXAMINATION: ECOG PERFORMANCE STATUS: 0 -  Asymptomatic  Filed Vitals:   07/19/15 0825  BP: 133/74  Pulse: 64  Temp: 98 F (36.7 C)  Resp: 18   Filed Weights   07/19/15 0825  Weight: 208 lb 12.8 oz (94.711 kg)    GENERAL:alert, no distress and comfortable SKIN: skin color, texture, turgor are normal, no rashes or significant lesions EYES: normal, Conjunctiva are pink and non-injected, sclera clear OROPHARYNX:no exudate, no erythema and lips, buccal mucosa, and tongue normal  NECK: supple, thyroid normal size, non-tender, without nodularity LYMPH:  no palpable lymphadenopathy in the cervical, axillary or inguinal LUNGS: clear to auscultation and percussion with normal breathing effort HEART: regular rate & rhythm and no murmurs and no lower extremity edema ABDOMEN:abdomen soft, non-tender and normal bowel sounds MUSCULOSKELETAL:no cyanosis of digits and no clubbing  NEURO: alert & oriented x 3 with fluent speech, no focal motor/sensory deficits EXTREMITIES: No lower extremity edema  LABORATORY DATA:  I have reviewed the data as listed   Chemistry      Component Value Date/Time   NA 138 07/19/2015 0809   K 4.4 07/19/2015 0809   CO2 24 07/19/2015 0809   BUN 14.3 07/19/2015 0809   CREATININE 1.0 07/19/2015 0809      Component Value Date/Time   CALCIUM 9.4 07/19/2015 0809   ALKPHOS 64 07/19/2015 0809   AST 42* 07/19/2015 0809   ALT 70* 07/19/2015 0809   BILITOT 0.76 07/19/2015 0809       Lab Results  Component Value Date   WBC 4.3 07/19/2015   HGB 14.7 07/19/2015   HCT 42.9 07/19/2015   MCV 99.8* 07/19/2015   PLT 156 07/19/2015   NEUTROABS 1.8 07/19/2015   ASSESSMENT & PLAN:  Hemochromatosis, hereditary ([place]) 06/16/2015 revealed Serum iron 188, iron saturation  77%, ferritin 1683, AST 62, ALT 103 MRI Liver 06/24/2015: No MRI evidence of iron deposition within the liver, spleen, marrow. 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 He will need resection. Dr. Zella Richer are plastered to surgery April 24 We will perform blood work today to evaluate his iron studies.  Elevated sugars: I instructed the patient to follow with his primary care physician to check his blood sugars and hemoglobin A1c because there is risk of diabetes with hemochromatosis.  E[facility] at [facility] showing incomplete right bundle branch block an echocardiogram was normal. I encouraged him to see a cardiologist as well.  Recommendation: Continue with lobotomy  Orders Placed This Encounter  Procedures  . CBC with Differential    Standing Status: Standing     Number of Occurrences: 20     Standing Expiration Date: 07/18/2016  . Comprehensive metabolic panel    Standing Status: Standing     Number of Occurrences: 20     Standing Expiration Date: 07/18/2016  . Iron and TIBC    Standing Status: Standing     Number of Occurrences: 20     Standing Expiration Date: 07/18/2016  . Ferritin    Standing Status: Standing     Number of Occurrences: 20     Standing Expiration Date: 07/18/2016   The patient has a good understanding of the overall plan. he agrees with it. he will call with any problems that may develop before the next visit here.   Rulon Eisenmenger, MD 07/19/2015

## 2015-07-20 ENCOUNTER — Encounter: Payer: Self-pay | Admitting: Hematology and Oncology

## 2015-08-02 ENCOUNTER — Ambulatory Visit (HOSPITAL_BASED_OUTPATIENT_CLINIC_OR_DEPARTMENT_OTHER): Payer: 59

## 2015-08-02 ENCOUNTER — Other Ambulatory Visit (HOSPITAL_BASED_OUTPATIENT_CLINIC_OR_DEPARTMENT_OTHER): Payer: 59

## 2015-08-02 VITALS — BP 113/85 | HR 69 | Temp 98.1°F | Resp 16

## 2015-08-02 DIAGNOSIS — R7989 Other specified abnormal findings of blood chemistry: Secondary | ICD-10-CM

## 2015-08-02 LAB — CBC WITH DIFFERENTIAL/PLATELET
BASO%: 0.9 % (ref 0.0–2.0)
Basophils Absolute: 0 10*3/uL (ref 0.0–0.1)
EOS%: 3.8 % (ref 0.0–7.0)
Eosinophils Absolute: 0.2 10*3/uL (ref 0.0–0.5)
HEMATOCRIT: 43.8 % (ref 38.4–49.9)
HEMOGLOBIN: 15 g/dL (ref 13.0–17.1)
LYMPH#: 1.5 10*3/uL (ref 0.9–3.3)
LYMPH%: 33.5 % (ref 14.0–49.0)
MCH: 34.3 pg — ABNORMAL HIGH (ref 27.2–33.4)
MCHC: 34.2 g/dL (ref 32.0–36.0)
MCV: 100.3 fL — AB (ref 79.3–98.0)
MONO#: 0.4 10*3/uL (ref 0.1–0.9)
MONO%: 7.7 % (ref 0.0–14.0)
NEUT%: 54.1 % (ref 39.0–75.0)
NEUTROS ABS: 2.5 10*3/uL (ref 1.5–6.5)
Platelets: 179 10*3/uL (ref 140–400)
RBC: 4.37 10*6/uL (ref 4.20–5.82)
RDW: 12.6 % (ref 11.0–14.6)
WBC: 4.6 10*3/uL (ref 4.0–10.3)

## 2015-08-02 LAB — IRON AND TIBC
%SAT: 30 % (ref 20–55)
Iron: 87 ug/dL (ref 42–163)
TIBC: 289 ug/dL (ref 202–409)
UIBC: 202 ug/dL (ref 117–376)

## 2015-08-02 LAB — COMPREHENSIVE METABOLIC PANEL
ALBUMIN: 4.1 g/dL (ref 3.5–5.0)
ALK PHOS: 61 U/L (ref 40–150)
ALT: 49 U/L (ref 0–55)
AST: 33 U/L (ref 5–34)
Anion Gap: 11 mEq/L (ref 3–11)
BILIRUBIN TOTAL: 0.65 mg/dL (ref 0.20–1.20)
BUN: 12.9 mg/dL (ref 7.0–26.0)
CO2: 22 mEq/L (ref 22–29)
Calcium: 9.5 mg/dL (ref 8.4–10.4)
Chloride: 107 mEq/L (ref 98–109)
Creatinine: 1 mg/dL (ref 0.7–1.3)
EGFR: 83 mL/min/{1.73_m2} — AB (ref >90)
GLUCOSE: 147 mg/dL — AB (ref 70–140)
Potassium: 4.2 mEq/L (ref 3.5–5.1)
SODIUM: 140 meq/L (ref 136–145)
TOTAL PROTEIN: 6.7 g/dL (ref 6.4–8.3)

## 2015-08-02 LAB — FERRITIN: Ferritin: 270 ng/ml (ref 22–316)

## 2015-08-02 NOTE — Patient Instructions (Signed)

## 2015-08-05 ENCOUNTER — Telehealth: Payer: Self-pay

## 2015-08-05 NOTE — Patient Instructions (Signed)
Tommy Barnett  08/05/2015   Your procedure is scheduled on: 08/12/2015    Report to [facility] Main  Entrance take [place]  elevators to 3rd floor to  [place] at     0830 AM.  Call this number if you have problems the morning of surgery [phone]   Remember: ONLY 1 PERSON MAY GO WITH YOU TO SHORT STAY TO GET  READY MORNING OF [place].  Do not eat food or drink liquids :After Midnight.     Take these medicines the morning of surgery with A SIP OF WATER: none                                 You may not have any metal on your body including hair pins and              piercings  Do not wear jewelry,  lotions, powders or perfumes, deodorant                       Men may shave face and neck.   Do not bring valuables to the hospital. [place].  Contacts, dentures or bridgework may not be worn into surgery.  Leave suitcase in the car. After surgery it may be brought to your room.     Patients discharged the day of surgery will not be allowed to drive home.  Name and phone number of your driver:  Special Instructions: coughing and deep breathing exercises, leg exercises               Please read over the following fact sheets you were given: _____________________________________________________________________             [facility] - Preparing for Surgery Before surgery, you can play an important role.  Because skin is not sterile, your skin needs to be as free of germs as possible.  You can reduce the number of germs on your skin by washing with CHG (chlorahexidine gluconate) soap before surgery.  CHG is an antiseptic cleaner which kills germs and bonds with the skin to continue killing germs even after washing. Please DO NOT use if you have an allergy to CHG or antibacterial soaps.  If your skin becomes reddened/irritated stop using the CHG and inform your nurse when you arrive at Short  Stay. Do not shave (including legs and underarms) for at least 48 hours prior to the first CHG shower.  You may shave your face/neck. Please follow these instructions carefully:  1.  Shower with CHG Soap the night before surgery and the  morning of Surgery.  2.  If you choose to wash your hair, wash your hair first as usual with your  normal  shampoo.  3.  After you shampoo, rinse your hair and body thoroughly to remove the  shampoo.                           4.  Use CHG as you would any other liquid soap.  You can apply chg directly  to the skin and wash  Gently with a scrungie or clean washcloth.  5.  Apply the CHG Soap to your body ONLY FROM THE NECK DOWN.   Do not use on face/ open                           Wound or open sores. Avoid contact with eyes, ears mouth and genitals (private parts).                       Wash face,  Genitals (private parts) with your normal soap.             6.  Wash thoroughly, paying special attention to the area where your surgery  will be performed.  7.  Thoroughly rinse your body with warm water from the neck down.  8.  DO NOT shower/wash with your normal soap after using and rinsing off  the CHG Soap.                9.  Pat yourself dry with a clean towel.            10.  Wear clean pajamas.            11.  Place clean sheets on your bed the night of your first shower and do not  sleep with pets. Day of Surgery : Do not apply any lotions/deodorants the morning of surgery.  Please wear clean clothes to the hospital/surgery center.  FAILURE TO FOLLOW THESE INSTRUCTIONS MAY RESULT IN THE CANCELLATION OF YOUR SURGERY PATIENT SIGNATURE_________________________________  NURSE SIGNATURE__________________________________  ________________________________________________________________________  WHAT IS A BLOOD TRANSFUSION? Blood Transfusion Information  A transfusion is the replacement of blood or some of its parts. Blood is made up of  multiple cells which provide different functions.  Red blood cells carry oxygen and are used for blood loss replacement.  White blood cells fight against infection.  Platelets control bleeding.  Plasma helps clot blood.  Other blood products are available for specialized needs, such as hemophilia or other clotting disorders. BEFORE THE TRANSFUSION  Who gives blood for transfusions?   Healthy volunteers who are fully evaluated to make sure their blood is safe. This is blood bank blood. Transfusion therapy is the safest it has ever been in the practice of medicine. Before blood is taken from a donor, a complete history is taken to make sure that person has no history of diseases nor engages in risky social behavior (examples are intravenous drug use or sexual activity with multiple partners). The donor's travel history is screened to minimize risk of transmitting infections, such as malaria. The donated blood is tested for signs of infectious diseases, such as HIV and hepatitis. The blood is then tested to be sure it is compatible with you in order to minimize the chance of a transfusion reaction. If you or a relative donates blood, this is often done in anticipation of surgery and is not appropriate for emergency situations. It takes many days to process the donated blood. RISKS AND COMPLICATIONS Although transfusion therapy is very safe and saves many lives, the main dangers of transfusion include:   Getting an infectious disease.  Developing a transfusion reaction. This is an allergic reaction to something in the blood you were given. Every precaution is taken to prevent this. The decision to have a blood transfusion has been considered carefully by your caregiver before blood is given. Blood is not given unless the benefits outweigh  the risks. AFTER THE TRANSFUSION  Right after receiving a blood transfusion, you will usually feel much better and more energetic. This is especially true if  your red blood cells have gotten low (anemic). The transfusion raises the level of the red blood cells which carry oxygen, and this usually causes an energy increase.  The nurse administering the transfusion will monitor you carefully for complications. HOME CARE INSTRUCTIONS  No special instructions are needed after a transfusion. You may find your energy is better. Speak with your caregiver about any limitations on activity for underlying diseases you may have. SEEK MEDICAL CARE IF:   Your condition is not improving after your transfusion.  You develop redness or irritation at the intravenous (IV) site. SEEK IMMEDIATE MEDICAL CARE IF:  Any of the following symptoms occur over the next 12 hours:  Shaking chills.  You have a temperature by mouth above 102 F (38.9 C), not controlled by medicine.  Chest, back, or muscle pain.  People around you feel you are not acting correctly or are confused.  Shortness of breath or difficulty breathing.  Dizziness and fainting.  You get a rash or develop hives.  You have a decrease in urine output.  Your urine turns a dark color or changes to pink, red, or brown. Any of the following symptoms occur over the next 10 days:  You have a temperature by mouth above 102 F (38.9 C), not controlled by medicine.  Shortness of breath.  Weakness after normal activity.  The white part of the eye turns yellow (jaundice).  You have a decrease in the amount of urine or are urinating less often.  Your urine turns a dark color or changes to pink, red, or brown. Document Released: 03/31/2000 Document Revised: 06/26/2011 Document Reviewed: 11/18/2007 [facility] Patient Information 2014 [place], [place].  _______________________________________________________________________

## 2015-08-05 NOTE — Telephone Encounter (Signed)
Pt called inquiring about lab results from 08/02/15 visit.  Discussed ferritin level and ALT/AST results with him.  Pt pleased to hear results and no further questions at time of call.

## 2015-08-09 ENCOUNTER — Encounter (HOSPITAL_COMMUNITY): Payer: Self-pay

## 2015-08-09 ENCOUNTER — Encounter (HOSPITAL_COMMUNITY)
Admission: RE | Admit: 2015-08-09 | Discharge: 2015-08-09 | Disposition: A | Discharge status: Home / Self Care | Payer: 59 | Source: Ambulatory Visit | Attending: General Surgery | Admitting: General Surgery

## 2015-08-09 HISTORY — DX: Essential (primary) hypertension: I10

## 2015-08-09 HISTORY — DX: Cardiac arrhythmia, unspecified: I49.9

## 2015-08-09 LAB — PROTIME-INR
INR: 1.12 (ref 0.00–1.49)
Prothrombin Time: 14.2 seconds (ref 11.6–15.2)

## 2015-08-09 LAB — ABO/RH: ABO/RH(D): O POS

## 2015-08-09 NOTE — Progress Notes (Signed)
CMP and CBC/DIFF done 08/02/15- [facility].

## 2015-08-09 NOTE — Progress Notes (Signed)
Requested EKG and info from [facility] in [place].   Patient at time of preop appt states he has misplaced his bowel prep instructions.  Called office and spoke with Triage Nurse who faxed to [facility].  Bowel Prep instruction sheet given to wife and patient aware.

## 2015-08-10 NOTE — Progress Notes (Signed)
Rerequested EKG and ECHO from [facility] in [place] with fax confirmation.

## 2015-08-10 NOTE — Progress Notes (Signed)
EKG-07/13/2015 on chart  ECHO- 07/14/2015 on chart  GI H and P on chart done 07/13/2015 for hereditary hemochromatosis  MR Elastography done 07/14/2015 on chart  MR Elastography with Liver on chart- 07/14/2015  US Abdomen 07/13/2015 on chart  Labs done 07/13/2015 on chart

## 2015-08-12 ENCOUNTER — Inpatient Hospital Stay (HOSPITAL_COMMUNITY)
Admission: AD | Admit: 2015-08-12 | Discharge: 2015-08-17 | DRG: 330 | Disposition: A | Discharge status: Home / Self Care | Payer: 59 | Source: Ambulatory Visit | Attending: General Surgery | Admitting: General Surgery

## 2015-08-12 ENCOUNTER — Encounter (HOSPITAL_COMMUNITY): Payer: Self-pay | Admitting: *Deleted

## 2015-08-12 ENCOUNTER — Inpatient Hospital Stay (HOSPITAL_COMMUNITY): Payer: 59 | Admitting: Anesthesiology

## 2015-08-12 ENCOUNTER — Encounter (HOSPITAL_COMMUNITY)
Admission: AD | Disposition: A | Discharge status: Home / Self Care | Payer: Self-pay | Source: Ambulatory Visit | Attending: General Surgery

## 2015-08-12 DIAGNOSIS — I10 Essential (primary) hypertension: Secondary | ICD-10-CM | POA: Diagnosis present

## 2015-08-12 DIAGNOSIS — D3A012 Benign carcinoid tumor of the ileum: Secondary | ICD-10-CM | POA: Diagnosis present

## 2015-08-12 DIAGNOSIS — Z809 Family history of malignant neoplasm, unspecified: Secondary | ICD-10-CM | POA: Diagnosis not present

## 2015-08-12 DIAGNOSIS — Z87891 Personal history of nicotine dependence: Secondary | ICD-10-CM

## 2015-08-12 DIAGNOSIS — Z01812 Encounter for preprocedural laboratory examination: Secondary | ICD-10-CM | POA: Diagnosis not present

## 2015-08-12 DIAGNOSIS — C7A012 Malignant carcinoid tumor of the ileum: Principal | ICD-10-CM | POA: Diagnosis present

## 2015-08-12 DIAGNOSIS — Z8249 Family history of ischemic heart disease and other diseases of the circulatory system: Secondary | ICD-10-CM

## 2015-08-12 DIAGNOSIS — Z833 Family history of diabetes mellitus: Secondary | ICD-10-CM

## 2015-08-12 DIAGNOSIS — K660 Peritoneal adhesions (postprocedural) (postinfection): Secondary | ICD-10-CM | POA: Diagnosis present

## 2015-08-12 DIAGNOSIS — C772 Secondary and unspecified malignant neoplasm of intra-abdominal lymph nodes: Secondary | ICD-10-CM | POA: Diagnosis present

## 2015-08-12 HISTORY — PX: COLON RESECTION: SHX5231

## 2015-08-12 LAB — TYPE AND SCREEN
ABO/RH(D): O POS
ANTIBODY SCREEN: NEGATIVE

## 2015-08-12 SURGERY — COLON RESECTION LAPAROSCOPIC
Anesthesia: General | Site: Abdomen

## 2015-08-12 MED ORDER — LACTATED RINGERS IV SOLN
INTRAVENOUS | Status: DC | PRN
  Administered 2015-08-12 (×2): via INTRAVENOUS

## 2015-08-12 MED ORDER — HYDROMORPHONE HCL 1 MG/ML IJ SOLN: 0.2500 mg | INTRAMUSCULAR | Status: DC | PRN

## 2015-08-12 MED ORDER — MIDAZOLAM HCL 5 MG/5ML IJ SOLN
INTRAMUSCULAR | Status: DC | PRN
  Administered 2015-08-12: 2 mg via INTRAVENOUS

## 2015-08-12 MED ORDER — LACTATED RINGERS IV SOLN
INTRAVENOUS | Status: DC
  Administered 2015-08-12: 1000 mL via INTRAVENOUS

## 2015-08-12 MED ORDER — SUGAMMADEX SODIUM 200 MG/2ML IV SOLN
INTRAVENOUS | Status: AC
  Filled 2015-08-12: qty 2

## 2015-08-12 MED ORDER — DIPHENHYDRAMINE HCL 12.5 MG/5ML PO ELIX
12.5000 mg | ORAL_SOLUTION | Freq: Four times a day (QID) | ORAL | Status: DC | PRN
  Filled 2015-08-12: qty 5

## 2015-08-12 MED ORDER — SODIUM CHLORIDE 0.9% FLUSH: 9.0000 mL | INTRAVENOUS | Status: DC | PRN

## 2015-08-12 MED ORDER — DEXAMETHASONE SODIUM PHOSPHATE 10 MG/ML IJ SOLN
INTRAMUSCULAR | Status: DC | PRN
  Administered 2015-08-12: 10 mg via INTRAVENOUS

## 2015-08-12 MED ORDER — CEFOTETAN DISODIUM-DEXTROSE 2-2.08 GM-% IV SOLR
2.0000 g | INTRAVENOUS | Status: AC
  Administered 2015-08-12: 2 g via INTRAVENOUS

## 2015-08-12 MED ORDER — LIDOCAINE HCL (CARDIAC) 20 MG/ML IV SOLN
INTRAVENOUS | Status: DC | PRN
  Administered 2015-08-12: 100 mg via INTRAVENOUS

## 2015-08-12 MED ORDER — DEXTROSE 5 % IV SOLN
2.0000 g | Freq: Two times a day (BID) | INTRAVENOUS | Status: AC
  Administered 2015-08-12: 2 g via INTRAVENOUS
  Filled 2015-08-12: qty 2

## 2015-08-12 MED ORDER — DIPHENHYDRAMINE HCL 50 MG/ML IJ SOLN
12.5000 mg | Freq: Four times a day (QID) | INTRAMUSCULAR | Status: DC | PRN
  Administered 2015-08-13: 12.5 mg via INTRAVENOUS
  Filled 2015-08-12: qty 1

## 2015-08-12 MED ORDER — SODIUM CHLORIDE 0.9 % IJ SOLN
INTRAMUSCULAR | Status: AC
  Filled 2015-08-12: qty 10

## 2015-08-12 MED ORDER — PROPOFOL 10 MG/ML IV BOLUS
INTRAVENOUS | Status: DC | PRN
  Administered 2015-08-12: 200 mg via INTRAVENOUS

## 2015-08-12 MED ORDER — BUPIVACAINE HCL (PF) 0.5 % IJ SOLN
INTRAMUSCULAR | Status: DC | PRN
  Administered 2015-08-12: 14 mL

## 2015-08-12 MED ORDER — ALVIMOPAN 12 MG PO CAPS
12.0000 mg | ORAL_CAPSULE | Freq: Two times a day (BID) | ORAL | Status: DC
  Administered 2015-08-13 – 2015-08-15 (×6): 12 mg via ORAL
  Filled 2015-08-12 (×8): qty 1

## 2015-08-12 MED ORDER — KCL IN DEXTROSE-NACL 20-5-0.9 MEQ/L-%-% IV SOLN
INTRAVENOUS | Status: DC
  Administered 2015-08-12 – 2015-08-15 (×6): via INTRAVENOUS
  Filled 2015-08-12 (×10): qty 1000

## 2015-08-12 MED ORDER — TETRACAINE HCL 0.5 % OP SOLN
1.0000 [drp] | OPHTHALMIC | Status: DC | PRN
  Administered 2015-08-12 (×2): 1 [drp] via OPHTHALMIC
  Filled 2015-08-12: qty 2

## 2015-08-12 MED ORDER — PROPOFOL 10 MG/ML IV BOLUS
INTRAVENOUS | Status: AC
  Filled 2015-08-12: qty 20

## 2015-08-12 MED ORDER — SUGAMMADEX SODIUM 200 MG/2ML IV SOLN
INTRAVENOUS | Status: DC | PRN
  Administered 2015-08-12: 200 mg via INTRAVENOUS

## 2015-08-12 MED ORDER — HYDROMORPHONE HCL 2 MG/ML IJ SOLN
INTRAMUSCULAR | Status: AC
  Filled 2015-08-12: qty 1

## 2015-08-12 MED ORDER — 0.9 % SODIUM CHLORIDE (POUR BTL) OPTIME
TOPICAL | Status: DC | PRN
  Administered 2015-08-12: 3000 mL

## 2015-08-12 MED ORDER — PHENYLEPHRINE HCL 10 MG/ML IJ SOLN
INTRAMUSCULAR | Status: DC | PRN
  Administered 2015-08-12: 40 ug via INTRAVENOUS
  Administered 2015-08-12: 80 ug via INTRAVENOUS
  Administered 2015-08-12 (×2): 40 ug via INTRAVENOUS
  Administered 2015-08-12: 120 ug via INTRAVENOUS
  Administered 2015-08-12: 40 ug via INTRAVENOUS

## 2015-08-12 MED ORDER — FENTANYL CITRATE (PF) 250 MCG/5ML IJ SOLN
INTRAMUSCULAR | Status: AC
  Filled 2015-08-12: qty 5

## 2015-08-12 MED ORDER — HYDROMORPHONE HCL 1 MG/ML IJ SOLN
INTRAMUSCULAR | Status: DC | PRN
  Administered 2015-08-12: .4 mg via INTRAVENOUS
  Administered 2015-08-12: .6 mg via INTRAVENOUS
  Administered 2015-08-12: .4 mg via INTRAVENOUS
  Administered 2015-08-12: .2 mg via INTRAVENOUS
  Administered 2015-08-12: .4 mg via INTRAVENOUS

## 2015-08-12 MED ORDER — ALVIMOPAN 12 MG PO CAPS
12.0000 mg | ORAL_CAPSULE | Freq: Once | ORAL | Status: AC
  Administered 2015-08-12: 12 mg via ORAL
  Filled 2015-08-12: qty 1

## 2015-08-12 MED ORDER — POLYMYXIN B-TRIMETHOPRIM 10000-0.1 UNIT/ML-% OP SOLN
1.0000 [drp] | Freq: Three times a day (TID) | OPHTHALMIC | Status: AC
  Administered 2015-08-12 – 2015-08-13 (×3): 1 [drp] via OPHTHALMIC
  Filled 2015-08-12: qty 10

## 2015-08-12 MED ORDER — ONDANSETRON HCL 4 MG/2ML IJ SOLN: 4.0000 mg | Freq: Four times a day (QID) | INTRAMUSCULAR | Status: DC | PRN

## 2015-08-12 MED ORDER — ROCURONIUM BROMIDE 100 MG/10ML IV SOLN
INTRAVENOUS | Status: DC | PRN
  Administered 2015-08-12: 50 mg via INTRAVENOUS
  Administered 2015-08-12 (×3): 20 mg via INTRAVENOUS
  Administered 2015-08-12: 10 mg via INTRAVENOUS

## 2015-08-12 MED ORDER — FENTANYL CITRATE (PF) 100 MCG/2ML IJ SOLN
INTRAMUSCULAR | Status: DC | PRN
  Administered 2015-08-12 (×2): 100 ug via INTRAVENOUS
  Administered 2015-08-12: 50 ug via INTRAVENOUS

## 2015-08-12 MED ORDER — MIDAZOLAM HCL 2 MG/2ML IJ SOLN
INTRAMUSCULAR | Status: AC
  Filled 2015-08-12: qty 2

## 2015-08-12 MED ORDER — ONDANSETRON HCL 4 MG PO TABS: 4.0000 mg | ORAL_TABLET | Freq: Four times a day (QID) | ORAL | Status: DC | PRN

## 2015-08-12 MED ORDER — CETYLPYRIDINIUM CHLORIDE 0.05 % MT LIQD
7.0000 mL | Freq: Two times a day (BID) | OROMUCOSAL | Status: DC
  Administered 2015-08-12 – 2015-08-16 (×8): 7 mL via OROMUCOSAL

## 2015-08-12 MED ORDER — CHLORHEXIDINE GLUCONATE 0.12 % MT SOLN
15.0000 mL | Freq: Two times a day (BID) | OROMUCOSAL | Status: DC
  Administered 2015-08-12 – 2015-08-17 (×9): 15 mL via OROMUCOSAL
  Filled 2015-08-12 (×13): qty 15

## 2015-08-12 MED ORDER — ONDANSETRON HCL 4 MG/2ML IJ SOLN: 4.0000 mg | INTRAMUSCULAR | Status: DC | PRN

## 2015-08-12 MED ORDER — CEFOTETAN DISODIUM-DEXTROSE 2-2.08 GM-% IV SOLR
INTRAVENOUS | Status: AC
Start: 2015-08-12 — End: 2015-08-12
  Filled 2015-08-12: qty 50

## 2015-08-12 MED ORDER — KETOROLAC TROMETHAMINE 0.5 % OP SOLN
1.0000 [drp] | Freq: Three times a day (TID) | OPHTHALMIC | Status: AC | PRN
  Administered 2015-08-12: 1 [drp] via OPHTHALMIC
  Filled 2015-08-12: qty 3

## 2015-08-12 MED ORDER — ROCURONIUM BROMIDE 100 MG/10ML IV SOLN
INTRAVENOUS | Status: AC
  Filled 2015-08-12: qty 1

## 2015-08-12 MED ORDER — LACTATED RINGERS IV SOLN: INTRAVENOUS | Status: DC

## 2015-08-12 MED ORDER — LACTATED RINGERS IR SOLN
Status: DC | PRN
Start: 2015-08-12 — End: 2015-08-12
  Administered 2015-08-12: 1000 mL

## 2015-08-12 MED ORDER — ONDANSETRON HCL 4 MG/2ML IJ SOLN
INTRAMUSCULAR | Status: AC
  Filled 2015-08-12: qty 2

## 2015-08-12 MED ORDER — PANTOPRAZOLE SODIUM 40 MG IV SOLR
40.0000 mg | INTRAVENOUS | Status: DC
  Administered 2015-08-12 – 2015-08-15 (×4): 40 mg via INTRAVENOUS
  Filled 2015-08-12 (×5): qty 40

## 2015-08-12 MED ORDER — HEPARIN SODIUM (PORCINE) 5000 UNIT/ML IJ SOLN
5000.0000 [IU] | Freq: Three times a day (TID) | INTRAMUSCULAR | Status: DC
  Administered 2015-08-13 – 2015-08-17 (×12): 5000 [IU] via SUBCUTANEOUS
  Filled 2015-08-12 (×15): qty 1

## 2015-08-12 MED ORDER — BUPIVACAINE-EPINEPHRINE 0.5% -1:200000 IJ SOLN
INTRAMUSCULAR | Status: AC
  Filled 2015-08-12: qty 1

## 2015-08-12 MED ORDER — NALOXONE HCL 0.4 MG/ML IJ SOLN: 0.4000 mg | INTRAMUSCULAR | Status: DC | PRN

## 2015-08-12 MED ORDER — MORPHINE SULFATE 2 MG/ML IV SOLN
INTRAVENOUS | Status: DC
  Administered 2015-08-12: 16:00:00 via INTRAVENOUS
  Administered 2015-08-13: 6 mg via INTRAVENOUS
  Administered 2015-08-13: 8 mg via INTRAVENOUS
  Administered 2015-08-13: 7 mg via INTRAVENOUS
  Administered 2015-08-14: 9 mg via INTRAVENOUS
  Administered 2015-08-14: 7 mg via INTRAVENOUS
  Administered 2015-08-14: 13 mg via INTRAVENOUS
  Administered 2015-08-14: 12.48 mg via INTRAVENOUS
  Administered 2015-08-15: 16:00:00 via INTRAVENOUS
  Administered 2015-08-15: 9 mg via INTRAVENOUS
  Administered 2015-08-16: 11 mg via INTRAVENOUS
  Administered 2015-08-16: 10 mg via INTRAVENOUS
  Filled 2015-08-12 (×4): qty 25

## 2015-08-12 MED ORDER — BSS IO SOLN
15.0000 mL | Freq: Once | INTRAOCULAR | Status: AC
  Administered 2015-08-12: 15 mL
  Filled 2015-08-12: qty 15

## 2015-08-12 SURGICAL SUPPLY — 70 items
APL SKNCLS STERI-STRIP NONHPOA (GAUZE/BANDAGES/DRESSINGS) ×1
APPLIER CLIP 5 13 M/L LIGAMAX5 (MISCELLANEOUS)
APPLIER CLIP ROT 10 11.4 M/L (STAPLE)
APR CLP MED LRG 11.4X10 (STAPLE)
APR CLP MED LRG 5 ANG JAW (MISCELLANEOUS)
BENZOIN TINCTURE PRP APPL 2/3 (GAUZE/BANDAGES/DRESSINGS) ×2 IMPLANT
BLADE EXTENDED COATED 6.5IN (ELECTRODE) IMPLANT
BLADE HEX COATED 2.75 (ELECTRODE) ×3 IMPLANT
CABLE HIGH FREQUENCY MONO STRZ (ELECTRODE) ×3 IMPLANT
CELLS DAT CNTRL 66122 CELL SVR (MISCELLANEOUS) ×1 IMPLANT
CLIP APPLIE 5 13 M/L LIGAMAX5 (MISCELLANEOUS) IMPLANT
CLIP APPLIE ROT 10 11.4 M/L (STAPLE) IMPLANT
CLOSURE WOUND 1/2 X4 (GAUZE/BANDAGES/DRESSINGS) ×1
COVER SURGICAL LIGHT HANDLE (MISCELLANEOUS) ×3 IMPLANT
DECANTER SPIKE VIAL GLASS SM (MISCELLANEOUS) ×1 IMPLANT
DISSECTOR BLUNT TIP ENDO 5MM (MISCELLANEOUS) IMPLANT
DRAPE LAPAROSCOPIC ABDOMINAL (DRAPES) ×3 IMPLANT
DRAPE UTILITY XL STRL (DRAPES) ×3 IMPLANT
DRSG TEGADERM 2-3/8X2-3/4 SM (GAUZE/BANDAGES/DRESSINGS) ×2 IMPLANT
ELECT REM PT RETURN 9FT ADLT (ELECTROSURGICAL) ×3
ELECTRODE REM PT RTRN 9FT ADLT (ELECTROSURGICAL) ×1 IMPLANT
FILTER SMOKE EVAC LAPAROSHD (FILTER) IMPLANT
GAUZE SPONGE 2X2 8PLY STRL LF (GAUZE/BANDAGES/DRESSINGS) IMPLANT
GAUZE SPONGE 4X4 12PLY STRL (GAUZE/BANDAGES/DRESSINGS) ×1 IMPLANT
GLOVE BIOGEL PI IND STRL 7.0 (GLOVE) ×1 IMPLANT
GLOVE BIOGEL PI INDICATOR 7.0 (GLOVE) ×8
GLOVE ECLIPSE 8.0 STRL XLNG CF (GLOVE) ×6 IMPLANT
GLOVE INDICATOR 8.0 STRL GRN (GLOVE) ×6 IMPLANT
GOWN STRL REUS W/TWL LRG LVL3 (GOWN DISPOSABLE) ×5 IMPLANT
GOWN STRL REUS W/TWL XL LVL3 (GOWN DISPOSABLE) ×10 IMPLANT
LEGGING LITHOTOMY PAIR STRL (DRAPES) IMPLANT
LIGASURE IMPACT 36 18CM CVD LR (INSTRUMENTS) ×2 IMPLANT
NS IRRIG 1000ML POUR BTL (IV SOLUTION) ×6 IMPLANT
PACK COLON (CUSTOM PROCEDURE TRAY) ×3 IMPLANT
RELOAD PROXIMATE 75MM BLUE (ENDOMECHANICALS) ×6 IMPLANT
RELOAD STAPLE 75 3.8 BLU REG (ENDOMECHANICALS) IMPLANT
RETRACTOR WND ALEXIS 18 MED (MISCELLANEOUS) IMPLANT
RTRCTR WOUND ALEXIS 18CM MED (MISCELLANEOUS) ×3
SCISSORS LAP 5X35 DISP (ENDOMECHANICALS) ×3 IMPLANT
SEALER TISSUE G2 STRG ARTC 35C (ENDOMECHANICALS) ×2 IMPLANT
SET IRRIG TUBING LAPAROSCOPIC (IRRIGATION / IRRIGATOR) ×2 IMPLANT
SHEARS HARMONIC ACE PLUS 36CM (ENDOMECHANICALS) ×2 IMPLANT
SLEEVE XCEL OPT CAN 5 100 (ENDOMECHANICALS) ×6 IMPLANT
SOLUTION ANTI FOG 6CC (MISCELLANEOUS) ×3 IMPLANT
SPONGE GAUZE 2X2 STER 10/PKG (GAUZE/BANDAGES/DRESSINGS) ×2
STAPLER PROXIMATE 75MM BLUE (STAPLE) ×2 IMPLANT
STAPLER VISISTAT 35W (STAPLE) ×3 IMPLANT
STRIP CLOSURE SKIN 1/2X4 (GAUZE/BANDAGES/DRESSINGS) ×1 IMPLANT
SUT PDS AB 1 CTX 36 (SUTURE) ×6 IMPLANT
SUT PDS AB 1 TP1 96 (SUTURE) ×4 IMPLANT
SUT PROLENE 2 0 SH DA (SUTURE) IMPLANT
SUT SILK 2 0 (SUTURE) ×3
SUT SILK 2 0 SH CR/8 (SUTURE) ×3 IMPLANT
SUT SILK 2-0 18XBRD TIE 12 (SUTURE) ×1 IMPLANT
SUT SILK 3 0 (SUTURE) ×3
SUT SILK 3 0 SH CR/8 (SUTURE) ×3 IMPLANT
SUT SILK 3-0 18XBRD TIE 12 (SUTURE) ×1 IMPLANT
SUT VICRYL 2 0 18  UND BR (SUTURE)
SUT VICRYL 2 0 18 UND BR (SUTURE) ×1 IMPLANT
SYS LAPSCP GELPORT 120MM (MISCELLANEOUS) ×3
SYSTEM LAPSCP GELPORT 120MM (MISCELLANEOUS) IMPLANT
TRAY FOLEY W/METER SILVER 14FR (SET/KITS/TRAYS/PACK) ×1 IMPLANT
TRAY FOLEY W/METER SILVER 16FR (SET/KITS/TRAYS/PACK) ×3 IMPLANT
TROCAR BLADELESS OPT 5 100 (ENDOMECHANICALS) ×2 IMPLANT
TROCAR BLADELESS OPT 5 75 (ENDOMECHANICALS) ×2 IMPLANT
TROCAR XCEL BLUNT TIP 100MML (ENDOMECHANICALS) IMPLANT
TROCAR XCEL NON-BLD 11X100MML (ENDOMECHANICALS) IMPLANT
TROCAR XCEL UNIV SLVE 11M 100M (ENDOMECHANICALS) IMPLANT
TUBING INSUF HEATED (TUBING) ×3 IMPLANT
YANKAUER SUCT BULB TIP NO VENT (SUCTIONS) ×3 IMPLANT

## 2015-08-12 NOTE — Progress Notes (Signed)
Wasted 5 mLs while priming line for PCA set up. Manus Gunning witnessed this event. Pharmacy [facility]) called and notified.

## 2015-08-12 NOTE — Op Note (Signed)
Operative Note  Tommy Barnett male 55 y.o. 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  Anesthesia: General endotracheal anesthesia  Indications:   This is a 55 year old male undergoing a colonoscopy. He was found to have a lesion in the ileocecal valve that was large and prolapsing through that area. 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 He now presents for the above operation.    Procedure Detail:  He was brought to the operating room and placed supine on the operating table under general anesthetic was given. A Foley catheter was inserted. An oral gastric tube was inserted. The hair and the abdominal wall was clipped. The abdominal wall was widely sterilely prepped and draped. A timeout was performed.  He was placed in slight reversed Trendelenburg position. A 5 mm incision was made in the left subcostal area. Using a 5 mm Optiview trocar and laparoscope, access was gained into the peritoneal cavity. A pneumoperitoneum was created by insufflation of carbon dioxide gas. Inspection of the area under the trocar the laparoscope demonstrated no evidence of organ injury or bleeding. A 5 mm trocar was placed in the subumbilical area. A 5 mm trocar was placed in the left lateral abdomen. A 5 mm trocar was placed in the right lower quadrant.  I inspected the liver. There tiny 1-2 mm areas of what appeared to be scar but no obvious metastatic disease.I identified the cecum. I then identified the terminal ileum. There are some very dense white-like adhesions between the terminal ileum and mid ileum and the lateral abdominal wall. I mobilized these sharply. I then mobilized the right colon by dividing its lateral attachments using the Harmonic scalpel and rotating the right  colon medially.  I mobilized the hepatic flexure using the Harmonic scalpel as well. I then mobilized the proximal half of the transverse colon using the harmonic scalpel and some blunt dissection. There also dense whitish areas of scar present here. I identified the right colic vessels and performed a high ligation of these  using the Endo seal device.  Following this, I felt I had adequate mobilization of the distal ileum, right colon, and the proximal half of the transverse colon. I made a right lower quadrant extraction site incision by incising the skin and dividing the subcutaneous tissue and anterior fascia. I mobilized the rectus muscle medially and then divided the posterior rectus sheath and peritoneum entering the peritoneal cavity. A wound protection device was placed. I then externalized the distal third of the ileum, right colon, and proximal third of the transverse colon. The tumor was bulky and palpable at the ileocecal valve. Some enlarged lymph nodes were noted at the hepatic flexure area. I divided the ileum 10 cm proximal to the ileocecal valve with a GIA stapler. I then divided the its mesentery with the LigaSure device. I divided the transverse colon mesentery with the LigaSure to include the enlarged lymph nodes that were noted.  I dissected the omentum free of the proximal third of the transverse colon. I then divided the transverse colon proximal to the middle colic vessels. The specimen was then handed off the field.  I inspected the transverse colon at its inferior division that looked a little dusky despite the mesentery being intact. I placed it back  into the abdominal cavity. I placed a GelPort in the extraction site incision area. I then reinsufflated the abdomen and inspected the transverse colon staple line and intracorporeally to see if there was stretch on the vessels making an ischemic. The appeared to be an obvious demarcation area of 8 cm of the transverse colon to  normal-appearing transverse colon. Mesentery to this 8 cm was still intact. There is no twisting of the mesentery. I subsequently externalized the distal ileum and transverse colon area again and resected the area of ischemic transverse colon by dividing it with the GIA stapler and dividing the mesentery close to the colon. The remaining transverse colon was pink and healthy.  A side-to-side anastomosis was then performed between the distal ileum and transverse colon using the linear cutting stapler. The staple lines were hemostatic. The common defect was closed with a linear noncutting stapler. A crotch stitch of 3-0 silk was placed. The anastomosis was patent, viable, and under no tension. It was dropped back into the abdominal cavity.  I then irrigated out the abdominal cavity with 1 L of irrigation through the extraction site area and evacuated it is much as possible. There is no evidence of organ injury or bleeding. I then closed the fascia of the extraction site incision in the right lower quadrant in 2 layers. The posterior anterior layers of both closed with running #1 PDS suture. Repeat laparoscopy was then performed. The fascial closure was solid. I irrigated out the abdominal cavity with a liter of saline laparoscopically and evacuated it is much as possible. Four-quadrant inspection demonstrated no evidence of bleeding or organ injury. I visualized the anastomosis intracorporeally and it was viable. The CO2 gas was released. The trocars were removed.  The trocar site skin incisions were closed with 4-0 Monocryl subcuticular stitches followed by Steri-Strips and sterile dressings. The extraction site skin was closed with staples. Sterile dressings were applied.  He tolerated the procedure well without any apparent complications. He was taken to the recovery room in satisfactory condition.   Estimated Blood Loss:  300 mL                 Specimens: distal ileum, right colon         Complications:  * No complications entered in OR log *         Disposition: PACU - hemodynamically stable.         Condition: stable

## 2015-08-12 NOTE — Anesthesia Preprocedure Evaluation (Signed)
Anesthesia Evaluation  Patient identified by MRN, date of birth, ID band Patient awake    Reviewed: Allergy & Precautions, H&P , NPO status , Patient's Chart, lab work & pertinent test results  Airway Mallampati: II  TM Distance: >3 FB Neck ROM: full    Dental no notable dental hx. (+) Dental Advisory Given, Teeth Intact   Pulmonary neg pulmonary ROS, former smoker,    Pulmonary exam normal breath sounds clear to auscultation       Cardiovascular Exercise Tolerance: Good hypertension, Pt. on medications Normal cardiovascular exam Rhythm:regular Rate:Normal  Skips a beat   Neuro/Psych negative neurological ROS  negative psych ROS   GI/Hepatic negative GI ROS, Neg liver ROS,   Endo/Other  negative endocrine ROS  Renal/GU negative Renal ROS  negative genitourinary   Musculoskeletal   Abdominal   Peds  Hematology negative hematology ROS (+)   Anesthesia Other Findings   Reproductive/Obstetrics negative OB ROS                             Anesthesia Physical Anesthesia Plan  ASA: II  Anesthesia Plan: General   Post-op Pain Management:    Induction: Intravenous  Airway Management Planned: Oral ETT  Additional Equipment:   Intra-op Plan:   Post-operative Plan: Extubation in OR  Informed Consent: I have reviewed the patients History and Physical, chart, labs and discussed the procedure including the risks, benefits and alternatives for the proposed anesthesia with the patient or authorized representative who has indicated his/her understanding and acceptance.   Dental Advisory Given  Plan Discussed with: CRNA and Surgeon  Anesthesia Plan Comments:         Anesthesia Quick Evaluation

## 2015-08-12 NOTE — Anesthesia Postprocedure Evaluation (Signed)
Anesthesia Post Note  Patient: Tommy Barnett  Procedure(s) Performed: Procedure(s) (LRB): COLON RESECTION LAPAROSCOPIC OF TERMINAL ILEUM AND PORTION ASCENDING COLON  (N/A)  Patient location during evaluation: PACU Anesthesia Type: General Level of consciousness: awake and alert Pain management: pain level controlled Vital Signs Assessment: post-procedure vital signs reviewed and stable Respiratory status: spontaneous breathing, nonlabored ventilation, respiratory function stable and patient connected to nasal cannula oxygen Cardiovascular status: blood pressure returned to baseline and stable Postop Assessment: no signs of nausea or vomiting Anesthetic complications: no    Last Vitals:  Filed Vitals:   08/12/15 1415 08/12/15 1430  BP: 111/76 108/79  Pulse: 82 77  Temp:    Resp: 13 15    Last Pain: There were no vitals filed for this visit.               Savhanna Sliva L

## 2015-08-12 NOTE — Interval H&P Note (Signed)
History and Physical Interval Note:  08/12/2015 10:27 AM  Tommy Barnett  has presented today for surgery, with the diagnosis of Carcinoid tumor of ileum   The various methods of treatment have been discussed with the patient and family. After consideration of risks, benefits and other options for treatment, the patient has consented to  Procedure(s): COLON RESECTION LAPAROSCOPIC OF TERMINAL ILEUM AND PORTION ASCENDING COLON  (N/A) as a surgical intervention .  The patient's history has been reviewed, patient examined, no change in status, stable for surgery.  I have reviewed the patient's chart and labs.  Questions were answered to the patient's satisfaction.     Shanie Mauzy Lenna Sciara

## 2015-08-12 NOTE — Transfer of Care (Signed)
Immediate Anesthesia Transfer of Care Note  Patient: Tommy Barnett  Procedure(s) Performed: Procedure(s): COLON RESECTION LAPAROSCOPIC OF TERMINAL ILEUM AND PORTION ASCENDING COLON  (N/A)  Patient Location: PACU  Anesthesia Type:General  Level of Consciousness:  sedated, patient cooperative and responds to stimulation  Airway & Oxygen Therapy:Patient Spontanous Breathing and Patient connected to face mask oxgen  Post-op Assessment:  Report given to PACU RN and Post -op Vital signs reviewed and stable  Post vital signs:  Reviewed and stable  Last Vitals:  Filed Vitals:   08/12/15 0833  BP: 113/72  Pulse: 69  Temp: 36.6 C  Resp: 18    Complications: No apparent anesthesia complications

## 2015-08-12 NOTE — Anesthesia Procedure Notes (Signed)
Procedure Name: Intubation Date/Time: 08/12/2015 10:38 AM Performed by: Anne Fu Pre-anesthesia Checklist: Patient identified, Emergency Drugs available, Suction available and Patient being monitored Patient Re-evaluated:Patient Re-evaluated prior to inductionOxygen Delivery Method: Circle System Utilized Preoxygenation: Pre-oxygenation with 100% oxygen Intubation Type: IV induction Ventilation: Mask ventilation without difficulty Laryngoscope Size: Mac and 4 Grade View: Grade I Tube type: Oral Tube size: 7.5 mm Number of attempts: 1 Airway Equipment and Method: Stylet Placement Confirmation: ETT inserted through vocal cords under direct vision,  positive ETCO2 and breath sounds checked- equal and bilateral Secured at: 23 cm Tube secured with: Tape Dental Injury: Teeth and Oropharynx as per pre-operative assessment

## 2015-08-12 NOTE — H&P (Signed)
H & P   The patient is a 55 year old male.   Note:He was referred by Dr. 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 This was discovered on a screening colonoscopy done 06/28/15. A mass was noted in the terminal ileum area. Biopsy was performed with the above pathology. Some other polyps removed that were benign. He has no carcinoid syndrome type symptoms. No family history of colorectal cancer. He has a diagnosis of hemachromatosis and is going to the [facility] in [place] for a second opinion on this. He leaves later today and is appointment this tomorrow. His wife is here with him.  Other Problems Jearld Fenton Morris, CMA) Back Pain Gastric Ulcer Gastroesophageal Reflux Disease High blood pressure  Past Surgical History Jearld Fenton Morris, CMA) Colon Polyp Removal - Colonoscopy Knee Surgery Left.  Diagnostic Studies History Jeralyn Ruths, [place]) Colonoscopy within last year  Allergies Jeralyn Ruths, [place]) No Known Drug Allergies 07/12/2015  Prior to Admission medications   Medication Sig Start Date End Date Taking? Authorizing Provider  lisinopril (PRINIVIL,ZESTRIL) 10 MG tablet Take 10 mg by mouth daily.    Yes Historical Provider, MD  sildenafil (VIAGRA) 100 MG tablet Take 50 mg by mouth as needed for erectile dysfunction.    Yes Historical Provider, MD     Social History Jearld Fenton Morris, CMA) Alcohol use Moderate alcohol use. Caffeine use Carbonated beverages, Tea. No drug use Tobacco use Former smoker.  Family History Jeralyn Ruths, [place]) Alcohol Abuse Brother, Father. Cancer Father, Mother. Diabetes Mellitus Brother, Father. Hypertension Father.     Review of Systems Jearld Fenton Morris CMA) General Not Present- Appetite Loss, Chills, Fatigue, Fever, Night Sweats, Weight Gain and Weight Loss. Skin Not Present- Change in Wart/Mole, Dryness, Hives, Jaundice, New Lesions, Non-Healing Wounds, Rash and  Ulcer. HEENT Not Present- Earache, Hearing Loss, Hoarseness, Nose Bleed, Oral Ulcers, Ringing in the Ears, Seasonal Allergies, Sinus Pain, Sore Throat, Visual Disturbances, Wears glasses/contact lenses and Yellow Eyes. Respiratory Not Present- Bloody sputum, Chronic Cough, Difficulty Breathing, Snoring and Wheezing. Breast Not Present- Breast Mass, Breast Pain, Nipple Discharge and Skin Changes. Cardiovascular Not Present- Chest Pain, Difficulty Breathing Lying Down, Leg Cramps, Palpitations, Rapid Heart Rate, Shortness of Breath and Swelling of Extremities. Gastrointestinal Not Present- Abdominal Pain, Bloating, Bloody Stool, Change in Bowel Habits, Chronic diarrhea, Constipation, Difficulty Swallowing, Excessive gas, Gets full quickly at meals, Hemorrhoids, Indigestion, Nausea, Rectal Pain and Vomiting. Musculoskeletal Not Present- Back Pain, Joint Pain, Joint Stiffness, Muscle Pain, Muscle Weakness and Swelling of Extremities. Neurological Not Present- Decreased Memory, Fainting, Headaches, Numbness, Seizures, Tingling, Tremor, Trouble walking and Weakness. Psychiatric Not Present- Anxiety, Bipolar, Change in Sleep Pattern, Depression, Fearful and Frequent crying. Endocrine Not Present- Cold Intolerance, Excessive Hunger, Hair Changes, Heat Intolerance, Hot flashes and New Diabetes. Hematology Not Present- Easy Bruising, Excessive bleeding, Gland problems, HIV and Persistent Infections.  Physical Exam Odis Hollingshead MD)  The physical exam findings are as follows: Note:General: WDWN in NAD. Pleasant and cooperative.  HEENT: [place]/AT, no facial masses  EYES: Wears glasses, EOMI, no icterus  NECK: Supple, no obvious mass.  CV: RRR.  CHEST: Breath sounds equal and clear. Respirations nonlabored.  ABDOMEN: Soft, nontender, nondistended, no masses, no organomegaly, active bowel sounds, no scars, no hernias.  MUSCULOSKELETAL: FROM, good muscle tone, no edema  LYMPHATIC: No palpable  cervical, supraclavicular.  SKIN: No jaundice.  NEUROLOGIC: Alert and oriented, answers questions appropriately.  PSYCHIATRIC: Normal mood, affect , and behavior.  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 I recommended resection of the distal ileum and part of the ascending colon by way of a laparoscopic-assisted technique. He and his wife are in agreement with this.  Plan: Laparoscopic-assisted resection of terminal ileum and part of ascending colon. I have explained the procedure and risks of the operation. Risks include but are not limited to bleeding, infection, wound problems, anesthesia, anastomotic leak, need for colostomy, need for reoperative surgery, injury to intraabominal organs (such as intestine, spleen, kidney, bladder, ureter, etc.), ileus, irregular bowel habits. He seems to understand and agrees with the plan. Bowel prep instructions were given to him.  Jackolyn Confer, MD

## 2015-08-12 NOTE — Progress Notes (Signed)
Notified Dr. Landry Dyke of completion of eye irrigation as order states.  Dr.  Landry Dyke stated that he would not be coming to assess eye, that statement should not have been part of the order, " I just needed to know that the eye had been irrigated and drops administered as ordered."  Dr. Landry Dyke states that this nurse does not need to call again regarding eye.

## 2015-08-12 NOTE — Addendum Note (Signed)
Addendum  created 08/12/15 1500 by Rod Mae, MD   Modules edited: Orders, PRL Based Order Sets

## 2015-08-12 NOTE — Addendum Note (Signed)
Addendum  created 08/12/15 2005 by Rod Mae, MD   Modules edited: Orders

## 2015-08-12 NOTE — Progress Notes (Signed)
This nurse witnessed Grace Blight, RN waste 75mls while priming Morphine PCA.  Pharmacy notified

## 2015-08-13 LAB — BASIC METABOLIC PANEL
ANION GAP: 8 (ref 5–15)
BUN: 10 mg/dL (ref 6–20)
CALCIUM: 8.5 mg/dL — AB (ref 8.9–10.3)
CO2: 24 mmol/L (ref 22–32)
Chloride: 109 mmol/L (ref 101–111)
Creatinine, Ser: 0.96 mg/dL (ref 0.61–1.24)
GFR calc Af Amer: >60 mL/min (ref >60)
Glucose, Bld: 144 mg/dL — ABNORMAL HIGH (ref 65–99)
Potassium: 4.6 mmol/L (ref 3.5–5.1)
SODIUM: 141 mmol/L (ref 135–145)

## 2015-08-13 LAB — CBC
HEMATOCRIT: 36.6 % — AB (ref 39.0–52.0)
HEMOGLOBIN: 12.9 g/dL — AB (ref 13.0–17.0)
MCH: 34.3 pg — ABNORMAL HIGH (ref 26.0–34.0)
MCHC: 35.2 g/dL (ref 30.0–36.0)
MCV: 97.3 fL (ref 78.0–100.0)
Platelets: 174 10*3/uL (ref 150–400)
RBC: 3.76 MIL/uL — ABNORMAL LOW (ref 4.22–5.81)
RDW: 11.8 % (ref 11.5–15.5)
WBC: 10 10*3/uL (ref 4.0–10.5)

## 2015-08-13 NOTE — Discharge Instructions (Addendum)
Removal of the Colon (Colectomy) Care After  HOME CARE INSTRUCTIONS  Once home, an ice pack applied to the operative site may help with discomfort and keep swelling down.   Change dressings as directed.   Only take over-the-counter or prescription medicines for pain, discomfort, or fever as directed by your caregiver.   Eat a bland lowfat diet.  Drink plenty of fluids.  Do not overeat.  Do not drive for at least 2-3 weeks and then only if you are no longer taking pain medicine.  There should be no heavy lifting (more than 10 pounds), strenuous activities or contact sports for at least 6 weeks, and then only after approval of your surgeon.   Keep the wound dry and clean.  You may shower.  The wound may be washed gently with water. Gently blot or dab dry following cleansing, without rubbing. Do not take baths, use swimming pools, or use hot tubs for 3 weeks, or as instructed by your caregivers.    Call the office [phone]) for a followup appointment. SEEK MEDICAL CARE IF (call the office):   There is redness, swelling, or increasing pain in the wound area.   Pus or blood is coming from the wound.   An unexplained oral temperature above 101.5 degrees F develops.   You notice a foul smell coming from the wound or dressing.   There is a breaking open of a wound (edges not staying together) after the sutures have been removed.   There is increasing abdominal pain.   You have persistent vomiting. SEEK IMMEDIATE MEDICAL CARE IF:   A rash develops.   There is difficulty breathing, or development of a reaction or side effects to medications given.  Document Released: 11/02/2003 Document Revised: 12/14/2010 Document Reviewed: 05/07/2007 [facility] Patient Information 2012 [place].

## 2015-08-13 NOTE — Progress Notes (Signed)
1 Day Post-Op  Subjective: Pain well controlled.  Has walked some.  No nausea.  We discussed the operation.  Objective: Vital signs in last 24 hours: Temp:  [97.7 F (36.5 C)-98.5 F (36.9 C)] 97.7 F (36.5 C) (04/28 0553) Pulse Rate:  [61-87] 61 (04/28 0553) Resp:  [9-18] 16 (04/28 0553) BP: (108-146)/(65-81) 111/65 mmHg (04/28 0553) SpO2:  [97 %-100 %] 100 % (04/28 0553) Weight:  [91.173 kg (201 lb)] 91.173 kg (201 lb) (04/27 0849) Last BM Date: 08/11/15  Intake/Output from previous day: 04/27 0701 - 04/28 0700 In: 1500 [I.V.:1500] Out: 2700 [Urine:2650; Blood:50] Intake/Output this shift:    PE: General- In NAD Abdomen-soft, dressings dry, bowel sounds present  Lab Results:   Recent Labs  08/13/15 0355  WBC 10.0  HGB 12.9*  HCT 36.6*  PLT 174   BMET  Recent Labs  08/13/15 0355  NA 141  K 4.6  CL 109  CO2 24  GLUCOSE 144*  BUN 10  CREATININE 0.96  CALCIUM 8.5*   PT/INR No results for input(s): LABPROT, INR in the last 72 hours. Comprehensive Metabolic Panel:    Component Value Date/Time   NA 141 08/13/2015 0355   NA 140 08/02/2015 0803   NA 138 07/19/2015 0809   K 4.6 08/13/2015 0355   K 4.2 08/02/2015 0803   K 4.4 07/19/2015 0809   CL 109 08/13/2015 0355   CO2 24 08/13/2015 0355   CO2 22 08/02/2015 0803   CO2 24 07/19/2015 0809   BUN 10 08/13/2015 0355   BUN 12.9 08/02/2015 0803   BUN 14.3 07/19/2015 0809   CREATININE 0.96 08/13/2015 0355   CREATININE 1.0 08/02/2015 0803   CREATININE 1.0 07/19/2015 0809   GLUCOSE 144* 08/13/2015 0355   GLUCOSE 147* 08/02/2015 0803   GLUCOSE 124 07/19/2015 0809   CALCIUM 8.5* 08/13/2015 0355   CALCIUM 9.5 08/02/2015 0803   CALCIUM 9.4 07/19/2015 0809   AST 33 08/02/2015 0803   AST 42* 07/19/2015 0809   ALT 49 08/02/2015 0803   ALT 70* 07/19/2015 0809   ALKPHOS 61 08/02/2015 0803   ALKPHOS 64 07/19/2015 0809   BILITOT 0.65 08/02/2015 0803   BILITOT 0.76 07/19/2015 0809   PROT 6.7 08/02/2015 0803    PROT 6.8 07/19/2015 0809   ALBUMIN 4.1 08/02/2015 0803   ALBUMIN 4.2 07/19/2015 0809     Studies/Results: No results found.  Anti-infectives: Anti-infectives    Start     Dose/Rate Route Frequency Ordered Stop   08/12/15 2200  cefoTEtan (CEFOTAN) 2 g in dextrose 5 % 50 mL IVPB     2 g 100 mL/hr over 30 Minutes Intravenous Every 12 hours 08/12/15 1507 08/12/15 2222   08/12/15 0825  cefoTEtan in Dextrose 5% (CEFOTAN) IVPB 2 g     2 g Intravenous On call to O.R. 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  Ambulate.  Clear liquids.   Noa Constante Lenna Sciara 08/13/2015

## 2015-08-14 NOTE — Progress Notes (Signed)
2 Days Post-Op  Subjective: More pain today. No flatus yet. Didn't take pain meds  Objective: Vital signs in last 24 hours: Temp:  [98.1 F (36.7 C)-99.6 F (37.6 C)] 99.6 F (37.6 C) (04/29 0550) Pulse Rate:  [63-91] 86 (04/29 0550) Resp:  [16-18] 18 (04/29 0550) BP: (121-149)/(71-82) 143/71 mmHg (04/29 0550) SpO2:  [96 %-100 %] 96 % (04/29 0550) Last BM Date: 08/12/15  Intake/Output from previous day: 04/28 0701 - 04/29 0700 In: 1859.2 [P.O.:600; I.V.:1259.2] Out: 1275 [Urine:1275] Intake/Output this shift:    Resp: clear to auscultation bilaterally Cardio: regular rate and rhythm GI: soft, mild tenderness. good bs. incisions look good  Lab Results:   Recent Labs  08/13/15 0355  WBC 10.0  HGB 12.9*  HCT 36.6*  PLT 174   BMET  Recent Labs  08/13/15 0355  NA 141  K 4.6  CL 109  CO2 24  GLUCOSE 144*  BUN 10  CREATININE 0.96  CALCIUM 8.5*   PT/INR No results for input(s): LABPROT, INR in the last 72 hours. ABG No results for input(s): PHART, HCO3 in the last 72 hours.  Invalid input(s): PCO2, PO2  Studies/Results: No results found.  Anti-infectives: Anti-infectives    Start     Dose/Rate Route Frequency Ordered Stop   08/12/15 2200  cefoTEtan (CEFOTAN) 2 g in dextrose 5 % 50 mL IVPB     2 g 100 mL/hr over 30 Minutes Intravenous Every 12 hours 08/12/15 1507 08/12/15 2222   08/12/15 0825  cefoTEtan in Dextrose 5% (CEFOTAN) IVPB 2 g     2 g Intravenous On call to O.R. 08/12/15 0825 08/12/15 1050      Assessment/Plan: s/p Procedure(s): COLON RESECTION LAPAROSCOPIC OF TERMINAL ILEUM AND PORTION ASCENDING COLON  (N/A) stay with clears today  Work on pain control OOB  LOS: 2 days    TOTH III,Deklynn Charlet S 08/14/2015

## 2015-08-15 NOTE — Progress Notes (Signed)
3 Days Post-Op  Subjective: Feels a little better today. Hasn't passed flatus but has more rumbling in abd. Pain when he walks around  Objective: Vital signs in last 24 hours: Temp:  [97.9 F (36.6 C)-99.6 F (37.6 C)] 98.5 F (36.9 C) (04/30 [id number]) Pulse Rate:  [76-85] 81 (04/30 0614) Resp:  [12-21] 14 (04/30 0614) BP: (139-156)/(76-89) 148/82 mmHg (04/30 0614) SpO2:  [93 %-98 %] 97 % (04/30 0614) Last BM Date: 08/12/15  Intake/Output from previous day: 04/29 0701 - 04/30 0700 In: 1220 [P.O.:720; I.V.:500] Out: 750 [Urine:750] Intake/Output this shift:    Resp: clear to auscultation bilaterally Cardio: regular rate and rhythm GI: soft, tender at incision. good bs  Lab Results:   Recent Labs  08/13/15 0355  WBC 10.0  HGB 12.9*  HCT 36.6*  PLT 174   BMET  Recent Labs  08/13/15 0355  NA 141  K 4.6  CL 109  CO2 24  GLUCOSE 144*  BUN 10  CREATININE 0.96  CALCIUM 8.5*   PT/INR No results for input(s): LABPROT, INR in the last 72 hours. ABG No results for input(s): PHART, HCO3 in the last 72 hours.  Invalid input(s): PCO2, PO2  Studies/Results: No results found.  Anti-infectives: Anti-infectives    Start     Dose/Rate Route Frequency Ordered Stop   08/12/15 2200  cefoTEtan (CEFOTAN) 2 g in dextrose 5 % 50 mL IVPB     2 g 100 mL/hr over 30 Minutes Intravenous Every 12 hours 08/12/15 1507 08/12/15 2222   08/12/15 0825  cefoTEtan in Dextrose 5% (CEFOTAN) IVPB 2 g     2 g Intravenous On call to O.R. 08/12/15 0825 08/12/15 1050      Assessment/Plan: s/p Procedure(s): COLON RESECTION LAPAROSCOPIC OF TERMINAL ILEUM AND PORTION ASCENDING COLON  (N/A) Advance diet. Start fulls today ambulate  LOS: 3 days    TOTH III,PAUL S 08/15/2015

## 2015-08-16 MED ORDER — LISINOPRIL 10 MG PO TABS
10.0000 mg | ORAL_TABLET | Freq: Every day | ORAL | Status: DC
  Administered 2015-08-16 – 2015-08-17 (×2): 10 mg via ORAL
  Filled 2015-08-16 (×2): qty 1

## 2015-08-16 MED ORDER — PANTOPRAZOLE SODIUM 40 MG PO TBEC
40.0000 mg | DELAYED_RELEASE_TABLET | Freq: Every day | ORAL | Status: DC
  Administered 2015-08-16 – 2015-08-17 (×2): 40 mg via ORAL
  Filled 2015-08-16 (×2): qty 1

## 2015-08-16 MED ORDER — OXYCODONE HCL 5 MG PO TABS
5.0000 mg | ORAL_TABLET | ORAL | Status: DC | PRN
Start: 2015-08-16 — End: 2015-08-17
  Administered 2015-08-16 (×2): 10 mg via ORAL
  Administered 2015-08-16: 5 mg via ORAL
  Administered 2015-08-17 (×3): 10 mg via ORAL
  Filled 2015-08-16: qty 2
  Filled 2015-08-16: qty 1
  Filled 2015-08-16 (×2): qty 2
  Filled 2015-08-16 (×2): qty 1
  Filled 2015-08-16 (×2): qty 2

## 2015-08-16 NOTE — Progress Notes (Signed)
4 Days Post-Op  Subjective: Feeling better overall today.  Passing gas.  Had a loose, liquid BM.  No nausea  Objective: Vital signs in last 24 hours: Temp:  [97.7 F (36.5 C)-99.8 F (37.7 C)] 98.9 F (37.2 C) (05/01 1106) Pulse Rate:  [72-90] 90 (05/01 1106) Resp:  [16-20] 18 (05/01 1106) BP: (143-159)/(76-86) 143/84 mmHg (05/01 1106) SpO2:  [94 %-99 %] 95 % (05/01 1106) Last BM Date: 08/16/15  Intake/Output from previous day: 04/30 0701 - 05/01 0700 In: 3016 [P.O.:1320; I.V.:1696] Out: -  Intake/Output this shift: Total I/O In: 240 [P.O.:240] Out: -   PE: General- In NAD Abdomen-soft, incisions are clean and intact  Lab Results:  No results for input(s): WBC, HGB, HCT, PLT in the last 72 hours. BMET No results for input(s): NA, K, CL, CO2, GLUCOSE, BUN, CREATININE, CALCIUM in the last 72 hours. PT/INR No results for input(s): LABPROT, INR in the last 72 hours. Comprehensive Metabolic Panel:    Component Value Date/Time   NA 141 08/13/2015 0355   NA 140 08/02/2015 0803   NA 138 07/19/2015 0809   K 4.6 08/13/2015 0355   K 4.2 08/02/2015 0803   K 4.4 07/19/2015 0809   CL 109 08/13/2015 0355   CO2 24 08/13/2015 0355   CO2 22 08/02/2015 0803   CO2 24 07/19/2015 0809   BUN 10 08/13/2015 0355   BUN 12.9 08/02/2015 0803   BUN 14.3 07/19/2015 0809   CREATININE 0.96 08/13/2015 0355   CREATININE 1.0 08/02/2015 0803   CREATININE 1.0 07/19/2015 0809   GLUCOSE 144* 08/13/2015 0355   GLUCOSE 147* 08/02/2015 0803   GLUCOSE 124 07/19/2015 0809   CALCIUM 8.5* 08/13/2015 0355   CALCIUM 9.5 08/02/2015 0803   CALCIUM 9.4 07/19/2015 0809   AST 33 08/02/2015 0803   AST 42* 07/19/2015 0809   ALT 49 08/02/2015 0803   ALT 70* 07/19/2015 0809   ALKPHOS 61 08/02/2015 0803   ALKPHOS 64 07/19/2015 0809   BILITOT 0.65 08/02/2015 0803   BILITOT 0.76 07/19/2015 0809   PROT 6.7 08/02/2015 0803   PROT 6.8 07/19/2015 0809   ALBUMIN 4.1 08/02/2015 0803   ALBUMIN 4.2 07/19/2015  0809     Studies/Results: No results found.  Anti-infectives: Anti-infectives    Start     Dose/Rate Route Frequency Ordered Stop   08/12/15 2200  cefoTEtan (CEFOTAN) 2 g in dextrose 5 % 50 mL IVPB     2 g 100 mL/hr over 30 Minutes Intravenous Every 12 hours 08/12/15 1507 08/12/15 2222   08/12/15 0825  cefoTEtan in Dextrose 5% (CEFOTAN) IVPB 2 g     2 g Intravenous On call to O.R. 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  Saline lock IV.  Oral analgesic.  Restart Lisinopril.   Maryetta Shafer J 08/16/2015

## 2015-08-16 NOTE — Progress Notes (Signed)
Pharmacy Brief Note - Alvimopan (Entereg)  The standing order set for alvimopan (Entereg) now includes an automatic order to discontinue the drug after the patient has had a bowel movement. The change was approved by the [facility] and the Medical Executive Committee.   This patient has had bowel movements documented by nursing. Therefore, alvimopan has been discontinued. If there are questions, please contact the pharmacy at [phone].   Thank you- Dia Sitter, PharmD, BCPS 08/16/2015 1:41 PM

## 2015-08-17 MED ORDER — OXYCODONE HCL 5 MG PO TABS: 5.0000 mg | ORAL_TABLET | ORAL | Status: DC | PRN

## 2015-08-17 NOTE — Progress Notes (Signed)
5 Days Post-Op  Subjective: Had a small BM yesterday.  Passing some gas.  Tolerating solid diet with no n/v.  Objective: Vital signs in last 24 hours: Temp:  [97.5 F (36.4 C)-98.9 F (37.2 C)] 98 F (36.7 C) (05/02 0600) Pulse Rate:  [73-90] 73 (05/02 0600) Resp:  [18-20] 18 (05/02 0600) BP: (123-143)/(77-84) 134/77 mmHg (05/02 0600) SpO2:  [95 %-98 %] 97 % (05/02 0600) Last BM Date: 08/16/15  Intake/Output from previous day: 05/01 0701 - 05/02 0700 In: 600 [P.O.:600] Out: -  Intake/Output this shift:    PE: General- In NAD Abdomen-soft, incisions are clean and intact, active bowel sounds  Lab Results:  No results for input(s): WBC, HGB, HCT, PLT in the last 72 hours. BMET No results for input(s): NA, K, CL, CO2, GLUCOSE, BUN, CREATININE, CALCIUM in the last 72 hours. PT/INR No results for input(s): LABPROT, INR in the last 72 hours. Comprehensive Metabolic Panel:    Component Value Date/Time   NA 141 08/13/2015 0355   NA 140 08/02/2015 0803   NA 138 07/19/2015 0809   K 4.6 08/13/2015 0355   K 4.2 08/02/2015 0803   K 4.4 07/19/2015 0809   CL 109 08/13/2015 0355   CO2 24 08/13/2015 0355   CO2 22 08/02/2015 0803   CO2 24 07/19/2015 0809   BUN 10 08/13/2015 0355   BUN 12.9 08/02/2015 0803   BUN 14.3 07/19/2015 0809   CREATININE 0.96 08/13/2015 0355   CREATININE 1.0 08/02/2015 0803   CREATININE 1.0 07/19/2015 0809   GLUCOSE 144* 08/13/2015 0355   GLUCOSE 147* 08/02/2015 0803   GLUCOSE 124 07/19/2015 0809   CALCIUM 8.5* 08/13/2015 0355   CALCIUM 9.5 08/02/2015 0803   CALCIUM 9.4 07/19/2015 0809   AST 33 08/02/2015 0803   AST 42* 07/19/2015 0809   ALT 49 08/02/2015 0803   ALT 70* 07/19/2015 0809   ALKPHOS 61 08/02/2015 0803   ALKPHOS 64 07/19/2015 0809   BILITOT 0.65 08/02/2015 0803   BILITOT 0.76 07/19/2015 0809   PROT 6.7 08/02/2015 0803   PROT 6.8 07/19/2015 0809   ALBUMIN 4.1 08/02/2015 0803   ALBUMIN 4.2 07/19/2015 0809     Studies/Results: No  results found.  Anti-infectives: Anti-infectives    Start     Dose/Rate Route Frequency Ordered Stop   08/12/15 2200  cefoTEtan (CEFOTAN) 2 g in dextrose 5 % 50 mL IVPB     2 g 100 mL/hr over 30 Minutes Intravenous Every 12 hours 08/12/15 1507 08/12/15 2222   08/12/15 0825  cefoTEtan in Dextrose 5% (CEFOTAN) IVPB 2 g     2 g Intravenous On call to O.R. 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  Instructions given to him and his wife.   Cythina Mickelsen J 08/17/2015

## 2015-08-18 ENCOUNTER — Encounter: Payer: Self-pay | Admitting: Hematology and Oncology

## 2015-08-18 ENCOUNTER — Encounter: Payer: Self-pay | Admitting: Oncology

## 2015-08-19 ENCOUNTER — Encounter: Payer: Self-pay | Admitting: General Surgery

## 2015-08-19 NOTE — Progress Notes (Unsigned)
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 I told him this would not change his postoperative care. He already sees Dr. Lindi Adie for his hemachromatosis and I told him I felt Dr. Lindi Adie could discuss further evaluation and long-term follow-up of this as well.

## 2015-08-21 ENCOUNTER — Emergency Department (HOSPITAL_COMMUNITY)
Admission: EM | Admit: 2015-08-21 | Discharge: 2015-08-21 | Disposition: A | Discharge status: Home / Self Care | Payer: 59 | Attending: Emergency Medicine | Admitting: Emergency Medicine

## 2015-08-21 ENCOUNTER — Emergency Department (HOSPITAL_COMMUNITY): Payer: 59

## 2015-08-21 ENCOUNTER — Encounter (HOSPITAL_COMMUNITY): Payer: Self-pay | Admitting: Emergency Medicine

## 2015-08-21 DIAGNOSIS — M79672 Pain in left foot: Secondary | ICD-10-CM | POA: Diagnosis present

## 2015-08-21 DIAGNOSIS — Z79899 Other long term (current) drug therapy: Secondary | ICD-10-CM | POA: Diagnosis not present

## 2015-08-21 DIAGNOSIS — I10 Essential (primary) hypertension: Secondary | ICD-10-CM | POA: Diagnosis not present

## 2015-08-21 DIAGNOSIS — Z79891 Long term (current) use of opiate analgesic: Secondary | ICD-10-CM | POA: Diagnosis not present

## 2015-08-21 DIAGNOSIS — Z96652 Presence of left artificial knee joint: Secondary | ICD-10-CM | POA: Diagnosis not present

## 2015-08-21 DIAGNOSIS — Z87891 Personal history of nicotine dependence: Secondary | ICD-10-CM | POA: Insufficient documentation

## 2015-08-21 NOTE — ED Provider Notes (Signed)
CSN: [id number]     Arrival date & time 08/21/15  1224 History   First MD Initiated Contact with Patient 08/21/15 1235     Chief Complaint  Patient presents with  . Foot Pain     (Consider location/radiation/quality/duration/timing/severity/associated sxs/prior Treatment) HPI  55 year old male presents with left foot pain and swelling. 10 days ago he had abdominal surgery. He had his ascending colon removed. Yesterday he knows some discomfort and mild swelling to his left rate proximal toe. Body may be sprained his foot but does not remember an injury. Today the pain and swelling seem to be low but worse. He rates it as a 3/10. There is no overall foot swelling, ankle swelling, or calf swelling. No other pain. No redness. He states worsened with range of motion of his toe but it is not severe. No chest symptoms. Patient looked online and is concerned for a blood clot and was especially concerned it was a clot in his toe.  Past Medical History  Diagnosis Date  . Hypertension   . Dysrhythmia     " skips a beat '    Past Surgical History  Procedure Laterality Date  . Joint replacement      left knee   . Colon resection N/A 08/12/2015    Procedure: COLON RESECTION LAPAROSCOPIC OF TERMINAL ILEUM AND PORTION ASCENDING COLON ;  Surgeon: Jackolyn Confer, MD;  Location: [facility];  Service: General;  Laterality: N/A;   No family history on file. Social History  Substance Use Topics  . Smoking status: Former Research scientist (life sciences)  . Smokeless tobacco: Never Used  . Alcohol Use: 4.8 oz/week    8 Glasses of wine per week     Comment: moderate     Review of Systems  Constitutional: Negative for fever.  Cardiovascular: Negative for leg swelling.  Musculoskeletal: Positive for joint swelling and arthralgias.  Skin: Negative for color change and wound.  Neurological: Negative for weakness and numbness.  All other systems reviewed and are negative.     Allergies  Review of patient's allergies indicates no  known allergies.  Home Medications   Prior to Admission medications   Medication Sig Start Date End Date Taking? Authorizing Provider  lisinopril (PRINIVIL,ZESTRIL) 10 MG tablet Take 10 mg by mouth daily.    Yes Historical Provider, MD  oxyCODONE (OXY IR/ROXICODONE) 5 MG immediate release tablet Take 1-2 tablets (5-10 mg total) by mouth every 4 (four) hours as needed for moderate pain. 08/17/15  Yes Jackolyn Confer, MD  sildenafil (VIAGRA) 100 MG tablet Take 50 mg by mouth as needed for erectile dysfunction.    Yes Historical Provider, MD   BP 137/87 mmHg  Pulse 109  Temp(Src) 98.4 F (36.9 C) (Oral)  Resp 18  SpO2 100% Physical Exam  Constitutional: He is oriented to person, place, and time. He appears well-developed and well-nourished.  HENT:  Head: Normocephalic and atraumatic.  Right Ear: External ear normal.  Left Ear: External ear normal.  Nose: Nose normal.  Eyes: Right eye exhibits no discharge. Left eye exhibits no discharge.  Neck: Neck supple.  Cardiovascular: Normal rate.   Pulses:      Dorsalis pedis pulses are 2+ on the left side.  Pulmonary/Chest: Effort normal.  Abdominal: He exhibits no distension.  Musculoskeletal: He exhibits no edema.       Left ankle: He exhibits normal range of motion and no swelling. No tenderness.       Left lower leg: He exhibits no tenderness and  no swelling.       Left foot: There is tenderness.       Feet:  Neurological: He is alert and oriented to person, place, and time.  Skin: Skin is warm and dry.  Nursing note and vitals reviewed.   ED Course  Procedures (including critical care time) Labs Review Labs Reviewed - No data to display  Imaging Review Dg Foot Complete Left  08/21/2015  CLINICAL DATA:  Left foot is swollen.  Left great toe joint pain. EXAM: LEFT FOOT - COMPLETE 3+ VIEW COMPARISON:  None. FINDINGS: Negative for fracture or dislocation. Soft tissues unremarkable. Mild joint space narrowing at the first MTP joint.  IMPRESSION: No acute bone abnormality. Electronically Signed   By: Markus Daft M.D.   On: 08/21/2015 14:05   I have personally reviewed and evaluated these images and lab results as part of my medical decision-making.   EKG Interpretation None      MDM   Final diagnoses:  Left foot pain    Patient's foot pain is of unclear etiology. He does have mild swelling over MTP. However no erythema, decreased range of motion, or warmth to suggest septic arthritis or gout. Possibly he did injure it. However this would appear to be mild. He is able to ablate without difficulty. His pain is controlled. Patient is highly concerned about blood clot but he has no swelling in his calf, ankle, or the rest of his foot. Is very isolated. Extremely low suspicion of DVT. Follow-up with PCP, discussed return precautions.    Sherwood Gambler, MD 08/21/15 [phone]

## 2015-08-21 NOTE — ED Notes (Signed)
Per pt, states he had abdominal surgery last week-woke up yesterday with his left foot swollen

## 2015-08-21 NOTE — ED Notes (Signed)
Pt reporting to Registration that he would like to leave and asking if xray results can be called to him.  Pt informed that we do not call w/ results.  He can either wait for the EDP or contact Medical Records in a couple days.

## 2015-08-23 ENCOUNTER — Encounter: Payer: Self-pay | Admitting: Hematology and Oncology

## 2015-08-23 ENCOUNTER — Telehealth: Payer: Self-pay | Admitting: Hematology and Oncology

## 2015-08-23 ENCOUNTER — Ambulatory Visit (HOSPITAL_BASED_OUTPATIENT_CLINIC_OR_DEPARTMENT_OTHER): Payer: 59

## 2015-08-23 ENCOUNTER — Ambulatory Visit (HOSPITAL_BASED_OUTPATIENT_CLINIC_OR_DEPARTMENT_OTHER): Payer: 59 | Admitting: Hematology and Oncology

## 2015-08-23 ENCOUNTER — Other Ambulatory Visit (HOSPITAL_BASED_OUTPATIENT_CLINIC_OR_DEPARTMENT_OTHER): Payer: 59

## 2015-08-23 VITALS — BP 127/78 | HR 99 | Temp 98.3°F | Resp 18 | Wt 192.8 lb

## 2015-08-23 VITALS — BP 115/73 | HR 98 | Temp 97.5°F | Resp 18

## 2015-08-23 DIAGNOSIS — M10072 Idiopathic gout, left ankle and foot: Secondary | ICD-10-CM

## 2015-08-23 DIAGNOSIS — C7A012 Malignant carcinoid tumor of the ileum: Secondary | ICD-10-CM

## 2015-08-23 DIAGNOSIS — M109 Gout, unspecified: Secondary | ICD-10-CM | POA: Insufficient documentation

## 2015-08-23 DIAGNOSIS — R7989 Other specified abnormal findings of blood chemistry: Secondary | ICD-10-CM

## 2015-08-23 DIAGNOSIS — D3A012 Benign carcinoid tumor of the ileum: Secondary | ICD-10-CM

## 2015-08-23 LAB — COMPREHENSIVE METABOLIC PANEL
ALBUMIN: 3.8 g/dL (ref 3.5–5.0)
ALK PHOS: 82 U/L (ref 40–150)
ALT: 31 U/L (ref 0–55)
ANION GAP: 10 meq/L (ref 3–11)
AST: 22 U/L (ref 5–34)
BUN: 15.7 mg/dL (ref 7.0–26.0)
CALCIUM: 10.1 mg/dL (ref 8.4–10.4)
CO2: 27 mEq/L (ref 22–29)
Chloride: 99 mEq/L (ref 98–109)
Creatinine: 1.2 mg/dL (ref 0.7–1.3)
EGFR: 70 mL/min/{1.73_m2} — AB (ref >90)
Glucose: 173 mg/dl — ABNORMAL HIGH (ref 70–140)
POTASSIUM: 4.5 meq/L (ref 3.5–5.1)
Sodium: 136 mEq/L (ref 136–145)
Total Bilirubin: 0.47 mg/dL (ref 0.20–1.20)
Total Protein: 7.9 g/dL (ref 6.4–8.3)

## 2015-08-23 LAB — CBC WITH DIFFERENTIAL/PLATELET
BASO%: 1 % (ref 0.0–2.0)
BASOS ABS: 0.1 10*3/uL (ref 0.0–0.1)
EOS ABS: 0.2 10*3/uL (ref 0.0–0.5)
EOS%: 2.6 % (ref 0.0–7.0)
HEMATOCRIT: 44.6 % (ref 38.4–49.9)
HEMOGLOBIN: 15.2 g/dL (ref 13.0–17.1)
LYMPH#: 1.9 10*3/uL (ref 0.9–3.3)
LYMPH%: 24.7 % (ref 14.0–49.0)
MCH: 32.6 pg (ref 27.2–33.4)
MCHC: 34.1 g/dL (ref 32.0–36.0)
MCV: 95.7 fL (ref 79.3–98.0)
MONO#: 0.6 10*3/uL (ref 0.1–0.9)
MONO%: 7.7 % (ref 0.0–14.0)
NEUT#: 5 10*3/uL (ref 1.5–6.5)
NEUT%: 64 % (ref 39.0–75.0)
Platelets: 327 10*3/uL (ref 140–400)
RBC: 4.66 10*6/uL (ref 4.20–5.82)
RDW: 12.3 % (ref 11.0–14.6)
WBC: 7.8 10*3/uL (ref 4.0–10.3)

## 2015-08-23 LAB — IRON AND TIBC
%SAT: 14 % — AB (ref 20–55)
IRON: 36 ug/dL — AB (ref 42–163)
TIBC: 268 ug/dL (ref 202–409)
UIBC: 231 ug/dL (ref 117–376)

## 2015-08-23 LAB — FERRITIN: Ferritin: 329 ng/ml — ABNORMAL HIGH (ref 22–316)

## 2015-08-23 MED ORDER — NAPROXEN 500 MG PO TABS: 500.0000 mg | ORAL_TABLET | Freq: Two times a day (BID) | ORAL | Status: DC

## 2015-08-23 NOTE — Patient Instructions (Signed)

## 2015-08-23 NOTE — Progress Notes (Signed)
Patient Care Team: [place] as PCP - General (Family Medicine)  DIAGNOSIS:  1. Hereditary hemochromatosis gets weekly phlebotomy 2. 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 Final pathology revealed a grade 2 carcinoid tumor 2 cm in size involving 4/18 lymph nodes. Prior to this he had an MRI of the liver which did not show any evidence of metastatic disease. He had undergone laparoscopic surgery and had done very well from it. 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 He had been to urgent care but they did not diagnose him as gout. He continued to have worsening symptoms and is here today in my office to discuss this current symptom. Patient lost about 20 pounds intentionally and is feeling very well.  REVIEW OF SYSTEMS:   Constitutional: Denies fevers, chills or abnormal weight loss Eyes: Denies blurriness of vision Ears, nose, mouth, throat, and face: Denies mucositis or sore throat Respiratory: Denies cough, dyspnea or wheezes Cardiovascular: Denies palpitation, chest discomfort Gastrointestinal:  Denies nausea, heartburn or change in bowel habits Skin: Denies abnormal skin rashes Lymphatics: Denies new lymphadenopathy or easy bruising Neurological:Denies numbness, tingling or new weaknesses Behavioral/Psych: Mood is stable, no new changes  Extremities: gouty arthritis involving his left great toe  All other systems were reviewed with the patient and are negative.  I have reviewed the past medical history, past surgical history, social history and family history with the patient  and they are unchanged from previous note.  ALLERGIES:  has No Known Allergies.  MEDICATIONS:  Current Outpatient Prescriptions  Medication Sig Dispense Refill  . lisinopril (PRINIVIL,ZESTRIL) 10 MG tablet Take 10 mg by mouth daily.     . naproxen (NAPROSYN) 500 MG tablet Take 1 tablet (500 mg total) by mouth 2 (two) times daily with a meal. 30 tablet 0  . oxyCODONE (OXY IR/ROXICODONE) 5 MG immediate release tablet Take 1-2 tablets (5-10 mg total) by mouth every 4 (four) hours as needed for moderate pain. 40 tablet 0  . sildenafil (VIAGRA) 100 MG tablet Take 50 mg by mouth as needed for erectile dysfunction.      No current facility-administered medications for this visit.    PHYSICAL EXAMINATION: ECOG PERFORMANCE STATUS: 1 - Symptomatic but completely ambulatory  Filed Vitals:   08/23/15 0903  BP: 127/78  Pulse: 99  Temp: 98.3 F (36.8 C)  Resp: 18   Filed Weights   08/23/15 0903  Weight: 192 lb 12.8 oz (87.454 kg)    GENERAL:alert, no distress and comfortable SKIN: skin color, texture, turgor are normal, no rashes or significant lesions EYES: normal, Conjunctiva are pink and non-injected, sclera clear OROPHARYNX:no exudate, no erythema and lips, buccal mucosa, and tongue normal  NECK: supple, thyroid normal size, non-tender, without nodularity LYMPH:  no palpable lymphadenopathy in the cervical, axillary or inguinal LUNGS: clear to auscultation and percussion with normal breathing effort HEART: regular rate & rhythm and no murmurs and no lower extremity edema ABDOMEN:abdomen soft, non-tender and normal bowel sounds MUSCULOSKELETAL:no cyanosis of digits and no clubbing  NEURO: alert & oriented x 3 with fluent speech, no focal motor/sensory deficits EXTREMITIES: No lower extremity edema  LABORATORY DATA:  I have reviewed  the data as listed   Chemistry      Component Value Date/Time   NA 136 08/23/2015 0857   NA 141 08/13/2015 0355   K 4.5 08/23/2015 0857   K 4.6  08/13/2015 0355   CL 109 08/13/2015 0355   CO2 27 08/23/2015 0857   CO2 24 08/13/2015 0355   BUN 15.7 08/23/2015 0857   BUN 10 08/13/2015 0355   CREATININE 1.2 08/23/2015 0857   CREATININE 0.96 08/13/2015 0355      Component Value Date/Time   CALCIUM 10.1 08/23/2015 0857   CALCIUM 8.5* 08/13/2015 0355   ALKPHOS 82 08/23/2015 0857   AST 22 08/23/2015 0857   ALT 31 08/23/2015 0857   BILITOT 0.47 08/23/2015 0857       Lab Results  Component Value Date   WBC 7.8 08/23/2015   HGB 15.2 08/23/2015   HCT 44.6 08/23/2015   MCV 95.7 08/23/2015   PLT 327 08/23/2015   NEUTROABS 5.0 08/23/2015     ASSESSMENT & PLAN:  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 Diffuse hepatic steatosis MRI liver [facility] [address] clinic): T2 measuring 10.2 ms, stage 1-2 fibrosis and evidence of iron overload  Current treatment: Phlebotomy with target ferritin below 50 and iron saturation below 45% started 06/18/2015  Plan: phlebotomy weekly x 4 and reassessment of iron studies to figure out the frequency of phlebotomy is needed. Return to clinic next 4 weeks for phlebotomies and I will see the patient back on June 6. Iron studies reveal a ferritin of 329, iron saturation 14%  Gout: I prescribed him Naprosyn 500 mg by mouth twice a day for 2 weeks. If his symptoms persist after one week, he will call his primary care doctor to see if he would need to go on colchicine and prednisone.  Return to clinic on  09/21/2015   Orders Placed This Encounter  Procedures  . Chromogranin A  . CBC with Differential    Standing Status: Future     Number of Occurrences:      Standing Expiration Date: 08/22/2016  . Ferritin    Standing Status: Future     Number of Occurrences:      Standing Expiration Date: 08/22/2016  . Iron and TIBC    Standing Status: Future     Number of Occurrences:      Standing Expiration Date: 08/22/2016   The patient has a good understanding of the overall plan. he agrees with it. he will call with any problems that may develop before the next visit here.   Rulon Eisenmenger, MD 08/23/2015

## 2015-08-23 NOTE — Telephone Encounter (Signed)
appt made and avs printed °

## 2015-08-23 NOTE — Assessment & Plan Note (Signed)
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

## 2015-08-23 NOTE — Assessment & Plan Note (Signed)
06/16/2015 revealed Serum iron 188, iron saturation 77%, ferritin 1683, AST 62, ALT 103 MRI Liver 06/24/2015: No MRI evidence of iron deposition within the liver, spleen, marrow. Diffuse hepatic steatosis MRI liver [facility] clinic): T2 measuring 10.2 ms, stage 1-2 fibrosis and evidence of iron overload  Current treatment: Phlebotomy with target ferritin below 50 and iron saturation below 45% started 06/18/2015  Plan: phlebotomy weekly x 2 and reassessment of iron studies to figure out the frequency of phlebotomy is needed. Return to clinic next 2 weeks for phlebotomies and I will see the patient back on June 6. Plan studies done today are pending.

## 2015-08-26 NOTE — Discharge Summary (Signed)
Physician Discharge Summary  Patient ID: Tommy Barnett MRN: [id number] DOB/AGE: Sep 26, 1960 55 y.o.  Admit date: 08/12/2015 Discharge date: 08/17/2015  Admission Diagnoses:  Carcinoid tumor of terminal ileum  Discharge Diagnoses:  Principal Problem:   Carcinoid tumor of ileum (T2N1) Active Problems:   Hemochromatosis, hereditary ([place])   Discharged Condition: good  Hospital Course: He underwent a laparoscopic right colectomy and resection of distal ileum 08/12/14.  He was put on the postcolectomy care pathway and did well.  By his 5th postop day he was tolerating his diet, ambulating independently and bowel were moving.  He was discharged.  Discharge instructions were given to him.   Discharge Exam: Blood pressure 118/73, pulse 73, temperature 98 F (36.7 C), temperature source Oral, resp. rate 18, height 6\' 1"  (1.854 m), weight 91.173 kg (201 lb), SpO2 97 %.   Disposition: 81-Discharged to home/self-care with a planned acute care hospital inpt readmission     Medication List    TAKE these medications        lisinopril 10 MG tablet  Commonly known as:  PRINIVIL,ZESTRIL  Take 10 mg by mouth daily.     oxyCODONE 5 MG immediate release tablet  Commonly known as:  Oxy IR/ROXICODONE  Take 1-2 tablets (5-10 mg total) by mouth every 4 (four) hours as needed for moderate pain.     sildenafil 100 MG tablet  Commonly known as:  VIAGRA  Take 50 mg by mouth as needed for erectile dysfunction.         Signed: Odis Hollingshead 08/26/2015, 9:16 AM

## 2015-08-31 ENCOUNTER — Ambulatory Visit (HOSPITAL_BASED_OUTPATIENT_CLINIC_OR_DEPARTMENT_OTHER): Payer: 59

## 2015-08-31 ENCOUNTER — Other Ambulatory Visit (HOSPITAL_BASED_OUTPATIENT_CLINIC_OR_DEPARTMENT_OTHER): Payer: 59

## 2015-08-31 DIAGNOSIS — R7989 Other specified abnormal findings of blood chemistry: Secondary | ICD-10-CM

## 2015-08-31 LAB — COMPREHENSIVE METABOLIC PANEL
ALBUMIN: 4.1 g/dL (ref 3.5–5.0)
ALK PHOS: 74 U/L (ref 40–150)
ALT: 29 U/L (ref 0–55)
AST: 21 U/L (ref 5–34)
Anion Gap: 8 mEq/L (ref 3–11)
BUN: 13.5 mg/dL (ref 7.0–26.0)
CALCIUM: 10.2 mg/dL (ref 8.4–10.4)
CHLORIDE: 104 meq/L (ref 98–109)
CO2: 27 mEq/L (ref 22–29)
Creatinine: 1 mg/dL (ref 0.7–1.3)
EGFR: 80 mL/min/{1.73_m2} — AB (ref >90)
Glucose: 137 mg/dl (ref 70–140)
Potassium: 4.5 mEq/L (ref 3.5–5.1)
SODIUM: 139 meq/L (ref 136–145)
Total Bilirubin: 0.41 mg/dL (ref 0.20–1.20)
Total Protein: 7.7 g/dL (ref 6.4–8.3)

## 2015-08-31 LAB — CBC WITH DIFFERENTIAL/PLATELET
BASO%: 0.6 % (ref 0.0–2.0)
Basophils Absolute: 0 10*3/uL (ref 0.0–0.1)
EOS%: 2.4 % (ref 0.0–7.0)
Eosinophils Absolute: 0.2 10*3/uL (ref 0.0–0.5)
HEMATOCRIT: 42 % (ref 38.4–49.9)
HGB: 14.7 g/dL (ref 13.0–17.1)
LYMPH#: 2.1 10*3/uL (ref 0.9–3.3)
LYMPH%: 31.8 % (ref 14.0–49.0)
MCH: 32.8 pg (ref 27.2–33.4)
MCHC: 35 g/dL (ref 32.0–36.0)
MCV: 93.8 fL (ref 79.3–98.0)
MONO#: 0.5 10*3/uL (ref 0.1–0.9)
MONO%: 7.7 % (ref 0.0–14.0)
NEUT%: 57.5 % (ref 39.0–75.0)
NEUTROS ABS: 3.9 10*3/uL (ref 1.5–6.5)
PLATELETS: 424 10*3/uL — AB (ref 140–400)
RBC: 4.48 10*6/uL (ref 4.20–5.82)
RDW: 12.1 % (ref 11.0–14.6)
WBC: 6.7 10*3/uL (ref 4.0–10.3)

## 2015-08-31 LAB — IRON AND TIBC
%SAT: 19 % — AB (ref 20–55)
Iron: 50 ug/dL (ref 42–163)
TIBC: 269 ug/dL (ref 202–409)
UIBC: 219 ug/dL (ref 117–376)

## 2015-08-31 LAB — FERRITIN: Ferritin: 234 ng/ml (ref 22–316)

## 2015-08-31 NOTE — Patient Instructions (Signed)

## 2015-09-01 LAB — CHROMOGRANIN A: Chromogranin A: 1 nmol/L (ref 0–5)

## 2015-09-03 ENCOUNTER — Telehealth: Payer: Self-pay

## 2015-09-03 NOTE — Telephone Encounter (Signed)
Pt called regarding labs done on 08/31/15.  Reviewed ferritin, AST, ALT, and glucose per pt request.  Pt without further questions at time of call.

## 2015-09-07 ENCOUNTER — Other Ambulatory Visit (HOSPITAL_BASED_OUTPATIENT_CLINIC_OR_DEPARTMENT_OTHER): Payer: 59

## 2015-09-07 ENCOUNTER — Ambulatory Visit (HOSPITAL_BASED_OUTPATIENT_CLINIC_OR_DEPARTMENT_OTHER): Payer: 59

## 2015-09-07 VITALS — BP 103/74 | HR 54 | Temp 98.9°F | Resp 18

## 2015-09-07 DIAGNOSIS — R7989 Other specified abnormal findings of blood chemistry: Secondary | ICD-10-CM

## 2015-09-07 LAB — COMPREHENSIVE METABOLIC PANEL
ALBUMIN: 3.8 g/dL (ref 3.5–5.0)
ALT: 26 U/L (ref 0–55)
AST: 19 U/L (ref 5–34)
Alkaline Phosphatase: 67 U/L (ref 40–150)
Anion Gap: 9 mEq/L (ref 3–11)
BILIRUBIN TOTAL: 0.31 mg/dL (ref 0.20–1.20)
BUN: 10.1 mg/dL (ref 7.0–26.0)
CO2: 24 meq/L (ref 22–29)
CREATININE: 1 mg/dL (ref 0.7–1.3)
Calcium: 9.6 mg/dL (ref 8.4–10.4)
Chloride: 108 mEq/L (ref 98–109)
EGFR: 89 mL/min/{1.73_m2} — AB (ref >90)
GLUCOSE: 171 mg/dL — AB (ref 70–140)
Potassium: 4.4 mEq/L (ref 3.5–5.1)
SODIUM: 141 meq/L (ref 136–145)
TOTAL PROTEIN: 7 g/dL (ref 6.4–8.3)

## 2015-09-07 LAB — CBC WITH DIFFERENTIAL/PLATELET
BASO%: 0.6 % (ref 0.0–2.0)
Basophils Absolute: 0 10*3/uL (ref 0.0–0.1)
EOS%: 4.4 % (ref 0.0–7.0)
Eosinophils Absolute: 0.3 10*3/uL (ref 0.0–0.5)
HCT: 41.8 % (ref 38.4–49.9)
HEMOGLOBIN: 14.2 g/dL (ref 13.0–17.1)
LYMPH%: 28.1 % (ref 14.0–49.0)
MCH: 32.5 pg (ref 27.2–33.4)
MCHC: 34 g/dL (ref 32.0–36.0)
MCV: 95.5 fL (ref 79.3–98.0)
MONO#: 0.4 10*3/uL (ref 0.1–0.9)
MONO%: 6.1 % (ref 0.0–14.0)
NEUT%: 60.8 % (ref 39.0–75.0)
NEUTROS ABS: 4.4 10*3/uL (ref 1.5–6.5)
Platelets: 253 10*3/uL (ref 140–400)
RBC: 4.38 10*6/uL (ref 4.20–5.82)
RDW: 12.6 % (ref 11.0–14.6)
WBC: 7.2 10*3/uL (ref 4.0–10.3)
lymph#: 2 10*3/uL (ref 0.9–3.3)

## 2015-09-07 LAB — IRON AND TIBC
%SAT: 24 % (ref 20–55)
Iron: 61 ug/dL (ref 42–163)
TIBC: 253 ug/dL (ref 202–409)
UIBC: 192 ug/dL (ref 117–376)

## 2015-09-07 LAB — FERRITIN: Ferritin: 150 ng/ml (ref 22–316)

## 2015-09-07 NOTE — Progress Notes (Signed)
Phlebotomy performed over approximately 10 minutes via right AC.  500gm removed using 16g phlebotomy kit.  Pt tolerated well.  Pt observed 30 minutes post procedure.  Drink provided.

## 2015-09-07 NOTE — Patient Instructions (Signed)

## 2015-09-10 ENCOUNTER — Telehealth: Payer: Self-pay | Admitting: *Deleted

## 2015-09-10 NOTE — Telephone Encounter (Signed)
Patient called requesting lab results. Called and left VMM with results.

## 2015-09-14 ENCOUNTER — Ambulatory Visit (HOSPITAL_BASED_OUTPATIENT_CLINIC_OR_DEPARTMENT_OTHER): Payer: 59

## 2015-09-14 ENCOUNTER — Other Ambulatory Visit (HOSPITAL_BASED_OUTPATIENT_CLINIC_OR_DEPARTMENT_OTHER): Payer: 59

## 2015-09-14 LAB — CBC WITH DIFFERENTIAL/PLATELET
BASO%: 0.4 % (ref 0.0–2.0)
Basophils Absolute: 0 10*3/uL (ref 0.0–0.1)
EOS%: 3.7 % (ref 0.0–7.0)
Eosinophils Absolute: 0.2 10*3/uL (ref 0.0–0.5)
HCT: 39.4 % (ref 38.4–49.9)
HEMOGLOBIN: 13.8 g/dL (ref 13.0–17.1)
LYMPH%: 29.8 % (ref 14.0–49.0)
MCH: 32.7 pg (ref 27.2–33.4)
MCHC: 35 g/dL (ref 32.0–36.0)
MCV: 93.4 fL (ref 79.3–98.0)
MONO#: 0.4 10*3/uL (ref 0.1–0.9)
MONO%: 7.1 % (ref 0.0–14.0)
NEUT%: 59 % (ref 39.0–75.0)
NEUTROS ABS: 3.3 10*3/uL (ref 1.5–6.5)
Platelets: 192 10*3/uL (ref 140–400)
RBC: 4.22 10*6/uL (ref 4.20–5.82)
RDW: 13 % (ref 11.0–14.6)
WBC: 5.6 10*3/uL (ref 4.0–10.3)
lymph#: 1.7 10*3/uL (ref 0.9–3.3)

## 2015-09-14 LAB — COMPREHENSIVE METABOLIC PANEL
ALBUMIN: 4 g/dL (ref 3.5–5.0)
ALK PHOS: 66 U/L (ref 40–150)
ALT: 24 U/L (ref 0–55)
AST: 19 U/L (ref 5–34)
Anion Gap: 9 mEq/L (ref 3–11)
BUN: 14.9 mg/dL (ref 7.0–26.0)
CO2: 23 meq/L (ref 22–29)
Calcium: 9.5 mg/dL (ref 8.4–10.4)
Chloride: 109 mEq/L (ref 98–109)
Creatinine: 1 mg/dL (ref 0.7–1.3)
EGFR: 89 mL/min/{1.73_m2} — AB (ref >90)
GLUCOSE: 152 mg/dL — AB (ref 70–140)
POTASSIUM: 4.5 meq/L (ref 3.5–5.1)
SODIUM: 141 meq/L (ref 136–145)
TOTAL PROTEIN: 7.1 g/dL (ref 6.4–8.3)
Total Bilirubin: 0.34 mg/dL (ref 0.20–1.20)

## 2015-09-14 LAB — IRON AND TIBC
%SAT: 21 % (ref 20–55)
Iron: 57 ug/dL (ref 42–163)
TIBC: 273 ug/dL (ref 202–409)
UIBC: 215 ug/dL (ref 117–376)

## 2015-09-14 LAB — FERRITIN: Ferritin: 111 ng/ml (ref 22–316)

## 2015-09-14 NOTE — Progress Notes (Signed)
Phlebotomy performed via right AC, 520 ml removed without problems. Patient tolerated well.

## 2015-09-14 NOTE — Patient Instructions (Signed)
Therapeutic Phlebotomy, Care After  Refer to this sheet in the next few weeks. These instructions provide you with information about caring for yourself after your procedure. Your health care provider may also give you more specific instructions. Your treatment has been planned according to current medical practices, but problems sometimes occur. Call your health care provider if you have any problems or questions after your procedure.  WHAT TO EXPECT AFTER THE PROCEDURE  After your procedure, it is common to have:   Light-headedness or dizziness. You may feel faint.   Nausea.   Tiredness.  HOME CARE INSTRUCTIONS  Activities   Return to your normal activities as directed by your health care provider. Most people can go back to their normal activities right away.   Avoid strenuous physical activity and heavy lifting or pulling for about 5 hours after the procedure. Do not lift anything that is heavier than 10 lb (4.5 kg).   Athletes should avoid strenuous exercise for at least 12 hours.   Change positions slowly for the remainder of the day. This will help to prevent light-headedness or fainting.   If you feel light-headed, lie down until the feeling goes away.  Eating and Drinking   Be sure to eat well-balanced meals for the next 24 hours.   Drink enough fluid to keep your urine clear or pale yellow.   Avoid drinking alcohol on the day that you had the procedure.  Care of the Needle Insertion Site   Keep your bandage dry. You can remove the bandage after about 5 hours or as directed by your health care provider.   If you have bleeding from the needle insertion site, elevate your arm and press firmly on the site until the bleeding stops.   If you have bruising at the site, apply ice to the area:   Put ice in a plastic bag.   Place a towel between your skin and the bag.   Leave the ice on for 20 minutes, 2-3 times a day for the first 24 hours.   If the swelling does not go away after 24 hours, apply  a warm, moist washcloth to the area for 20 minutes, 2-3 times a day.  General Instructions   Avoid smoking for at least 30 minutes after the procedure.   Keep all follow-up visits as directed by your health care provider. It is important to continue with further therapeutic phlebotomy treatments as directed.  SEEK MEDICAL CARE IF:   You have redness, swelling, or pain at the needle insertion site.   You have fluid, blood, or pus coming from the needle insertion site.   You feel light-headed, dizzy, or nauseated, and the feeling does not go away.   You notice new bruising at the needle insertion site.   You feel weaker than normal.   You have a fever or chills.  SEEK IMMEDIATE MEDICAL CARE IF:   You have severe nausea or vomiting.   You have chest pain.   You have trouble breathing.    This information is not intended to replace advice given to you by your health care provider. Make sure you discuss any questions you have with your health care provider.    Document Released: 09/05/2010 Document Revised: 08/18/2014 Document Reviewed: 03/30/2014  Elsevier Interactive Patient Education 2016 [employer]

## 2015-09-21 ENCOUNTER — Telehealth: Payer: Self-pay | Admitting: Hematology and Oncology

## 2015-09-21 ENCOUNTER — Other Ambulatory Visit (HOSPITAL_BASED_OUTPATIENT_CLINIC_OR_DEPARTMENT_OTHER): Payer: 59

## 2015-09-21 ENCOUNTER — Ambulatory Visit (HOSPITAL_BASED_OUTPATIENT_CLINIC_OR_DEPARTMENT_OTHER): Payer: 59 | Admitting: Hematology and Oncology

## 2015-09-21 ENCOUNTER — Encounter: Payer: Self-pay | Admitting: Hematology and Oncology

## 2015-09-21 ENCOUNTER — Ambulatory Visit (HOSPITAL_BASED_OUTPATIENT_CLINIC_OR_DEPARTMENT_OTHER): Payer: 59

## 2015-09-21 VITALS — BP 127/84 | HR 82 | Temp 97.2°F | Resp 18

## 2015-09-21 DIAGNOSIS — D3A012 Benign carcinoid tumor of the ileum: Secondary | ICD-10-CM | POA: Diagnosis not present

## 2015-09-21 DIAGNOSIS — R7989 Other specified abnormal findings of blood chemistry: Secondary | ICD-10-CM

## 2015-09-21 LAB — COMPREHENSIVE METABOLIC PANEL
ALT: 25 U/L (ref 0–55)
AST: 19 U/L (ref 5–34)
Albumin: 4 g/dL (ref 3.5–5.0)
Alkaline Phosphatase: 68 U/L (ref 40–150)
Anion Gap: 8 mEq/L (ref 3–11)
BILIRUBIN TOTAL: 0.63 mg/dL (ref 0.20–1.20)
BUN: 10.9 mg/dL (ref 7.0–26.0)
CHLORIDE: 106 meq/L (ref 98–109)
CO2: 26 meq/L (ref 22–29)
CREATININE: 1.1 mg/dL (ref 0.7–1.3)
Calcium: 9.5 mg/dL (ref 8.4–10.4)
EGFR: 77 mL/min/{1.73_m2} — ABNORMAL LOW (ref >90)
GLUCOSE: 220 mg/dL — AB (ref 70–140)
Potassium: 4.8 mEq/L (ref 3.5–5.1)
SODIUM: 140 meq/L (ref 136–145)
TOTAL PROTEIN: 6.8 g/dL (ref 6.4–8.3)

## 2015-09-21 LAB — CBC WITH DIFFERENTIAL/PLATELET
BASO%: 0.6 % (ref 0.0–2.0)
Basophils Absolute: 0 10*3/uL (ref 0.0–0.1)
EOS%: 3.3 % (ref 0.0–7.0)
Eosinophils Absolute: 0.2 10*3/uL (ref 0.0–0.5)
HEMATOCRIT: 38.5 % (ref 38.4–49.9)
HEMOGLOBIN: 13.4 g/dL (ref 13.0–17.1)
LYMPH#: 1.5 10*3/uL (ref 0.9–3.3)
LYMPH%: 28.5 % (ref 14.0–49.0)
MCH: 32.4 pg (ref 27.2–33.4)
MCHC: 34.8 g/dL (ref 32.0–36.0)
MCV: 93 fL (ref 79.3–98.0)
MONO#: 0.3 10*3/uL (ref 0.1–0.9)
MONO%: 5.5 % (ref 0.0–14.0)
NEUT#: 3.3 10*3/uL (ref 1.5–6.5)
NEUT%: 62.1 % (ref 39.0–75.0)
Platelets: 185 10*3/uL (ref 140–400)
RBC: 4.14 10*6/uL — ABNORMAL LOW (ref 4.20–5.82)
RDW: 13.1 % (ref 11.0–14.6)
WBC: 5.2 10*3/uL (ref 4.0–10.3)

## 2015-09-21 LAB — IRON AND TIBC
%SAT: 24 % (ref 20–55)
IRON: 66 ug/dL (ref 42–163)
TIBC: 275 ug/dL (ref 202–409)
UIBC: 210 ug/dL (ref 117–376)

## 2015-09-21 LAB — FERRITIN: FERRITIN: 79 ng/mL (ref 22–316)

## 2015-09-21 NOTE — Assessment & Plan Note (Addendum)
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

## 2015-09-21 NOTE — Telephone Encounter (Signed)
appt made and avs printed °

## 2015-09-21 NOTE — Progress Notes (Signed)
Patient Care Team: [place] as PCP - General (Family Medicine)  DIAGNOSIS:  1. Hereditary hemochromatosis gets weekly phlebotomy 2. 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 He has been getting weekly phlebotomies and his hemoglobin has remained stable and his ferritin has slowly declined over time. He does not seem to be having any major problems with phlebotomy. His abdomen has healed very well. His weight has been excellent. He had lost intentionally 20 pounds. He feels well.  REVIEW OF SYSTEMS:   Constitutional: Denies fevers, chills or abnormal weight loss Eyes: Denies blurriness of vision Ears, nose, mouth, throat, and face: Denies mucositis or sore throat Respiratory: Denies cough, dyspnea or wheezes Cardiovascular: Denies palpitation, chest discomfort Gastrointestinal:  Denies nausea, heartburn or change in bowel habits Skin: Denies abnormal skin rashes Lymphatics: Denies new lymphadenopathy or easy bruising Neurological:Denies numbness, tingling or new weaknesses Behavioral/Psych: Mood is stable, no new changes  Extremities: No lower extremity edema  All other systems were reviewed with the patient and are negative.  I have reviewed the past medical history, past surgical history, social history and family history with the patient and they are unchanged from previous note.  ALLERGIES:  has No Known Allergies.  MEDICATIONS:  Current Outpatient Prescriptions  Medication Sig Dispense Refill  . lisinopril (PRINIVIL,ZESTRIL) 10 MG tablet Take 10 mg by mouth daily.     . sildenafil (VIAGRA) 100 MG tablet Take 50 mg by mouth as needed for erectile dysfunction.      No current facility-administered medications for this visit.     PHYSICAL EXAMINATION: ECOG PERFORMANCE STATUS: 0 - Asymptomatic  Filed Vitals:   09/21/15 0953  BP: 133/78  Pulse: 70  Temp: 98.3 F (36.8 C)  Resp: 18   Filed Weights   09/21/15 0953  Weight: 194 lb 9.6 oz (88.27 kg)    GENERAL:alert, no distress and comfortable SKIN: skin color, texture, turgor are normal, no rashes or significant lesions EYES: normal, Conjunctiva are pink and non-injected, sclera clear OROPHARYNX:no exudate, no erythema and lips, buccal mucosa, and tongue normal  NECK: supple, thyroid normal size, non-tender, without nodularity LYMPH:  no palpable lymphadenopathy in the cervical, axillary or inguinal LUNGS: clear to auscultation and percussion with normal breathing effort HEART: regular rate & rhythm and no murmurs and no lower extremity edema ABDOMEN:abdomen soft, non-tender and normal bowel sounds MUSCULOSKELETAL:no cyanosis of digits and no clubbing  NEURO: alert & oriented x 3 with fluent speech, no focal motor/sensory deficits EXTREMITIES: No lower extremity edema  LABORATORY DATA:  I have reviewed the data as listed   Chemistry      Component Value Date/Time   NA 140 09/21/2015 0940   NA 141 08/13/2015 0355   K 4.8 09/21/2015 0940   K 4.6 08/13/2015 0355   CL 109 08/13/2015 0355   CO2 26 09/21/2015 0940   CO2 24 08/13/2015 0355   BUN 10.9 09/21/2015 0940   BUN 10 08/13/2015 0355   CREATININE 1.1 09/21/2015 0940   CREATININE 0.96 08/13/2015 0355      Component Value Date/Time   CALCIUM 9.5 09/21/2015 0940   CALCIUM 8.5* 08/13/2015 0355   ALKPHOS 68 09/21/2015 0940   AST 19 09/21/2015 0940   ALT 25 09/21/2015 0940   BILITOT 0.63 09/21/2015 0940  Lab Results  Component Value Date   WBC 5.2 09/21/2015   HGB 13.4 09/21/2015   HCT 38.5 09/21/2015   MCV 93.0 09/21/2015   PLT 185 09/21/2015   NEUTROABS 3.3 09/21/2015     ASSESSMENT & PLAN:  Hemochromatosis, hereditary ([place]) 06/16/2015 revealed Serum iron 188, iron  saturation 77%, ferritin 1683, AST 62, ALT 103 MRI Liver 06/24/2015: No MRI evidence of iron deposition within the liver, spleen, marrow. Diffuse hepatic steatosis MRI liver [facility] clinic): T2 measuring 10.2 ms, stage 1-2 fibrosis and evidence of iron overload  Current treatment: Phlebotomy with target ferritin below 50 and iron saturation below 45% started 06/18/2015  Plan: phlebotomy weekly x 4 and reassessment of iron studies to figure out the frequency of phlebotomy is needed. Return to clinic next 4 weeks for phlebotomies and I will see the patient back on June 6. 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   The patient has a good understanding of the overall plan. he agrees with it. he will call with any problems that may develop before the next visit here.   Rulon Eisenmenger, MD 09/21/2015

## 2015-09-21 NOTE — Assessment & Plan Note (Signed)
06/16/2015 revealed Serum iron 188, iron saturation 77%, ferritin 1683, AST 62, ALT 103 MRI Liver 06/24/2015: No MRI evidence of iron deposition within the liver, spleen, marrow. Diffuse hepatic steatosis MRI liver [facility] clinic): T2 measuring 10.2 ms, stage 1-2 fibrosis and evidence of iron overload  Current treatment: Phlebotomy with target ferritin below 50 and iron saturation below 45% started 06/18/2015  Plan: phlebotomy weekly x 4 and reassessment of iron studies to figure out the frequency of phlebotomy is needed. Return to clinic next 4 weeks for phlebotomies and I will see the patient back on June 6. Iron studies reveal a ferritin of 329, iron saturation 14%  Gout: resolved

## 2015-09-21 NOTE — Progress Notes (Signed)
Phlebotomy performed in right AC without incident.  500 gm collected. Patient declined to stay the entire 30 minutes for observation. Asymptomatic.

## 2015-09-21 NOTE — Patient Instructions (Signed)
Therapeutic Phlebotomy, Care After  Refer to this sheet in the next few weeks. These instructions provide you with information about caring for yourself after your procedure. Your health care provider may also give you more specific instructions. Your treatment has been planned according to current medical practices, but problems sometimes occur. Call your health care provider if you have any problems or questions after your procedure.  WHAT TO EXPECT AFTER THE PROCEDURE  After your procedure, it is common to have:   Light-headedness or dizziness. You may feel faint.   Nausea.   Tiredness.  HOME CARE INSTRUCTIONS  Activities   Return to your normal activities as directed by your health care provider. Most people can go back to their normal activities right away.   Avoid strenuous physical activity and heavy lifting or pulling for about 5 hours after the procedure. Do not lift anything that is heavier than 10 lb (4.5 kg).   Athletes should avoid strenuous exercise for at least 12 hours.   Change positions slowly for the remainder of the day. This will help to prevent light-headedness or fainting.   If you feel light-headed, lie down until the feeling goes away.  Eating and Drinking   Be sure to eat well-balanced meals for the next 24 hours.   Drink enough fluid to keep your urine clear or pale yellow.   Avoid drinking alcohol on the day that you had the procedure.  Care of the Needle Insertion Site   Keep your bandage dry. You can remove the bandage after about 5 hours or as directed by your health care provider.   If you have bleeding from the needle insertion site, elevate your arm and press firmly on the site until the bleeding stops.   If you have bruising at the site, apply ice to the area:   Put ice in a plastic bag.   Place a towel between your skin and the bag.   Leave the ice on for 20 minutes, 2-3 times a day for the first 24 hours.   If the swelling does not go away after 24 hours, apply  a warm, moist washcloth to the area for 20 minutes, 2-3 times a day.  General Instructions   Avoid smoking for at least 30 minutes after the procedure.   Keep all follow-up visits as directed by your health care provider. It is important to continue with further therapeutic phlebotomy treatments as directed.  SEEK MEDICAL CARE IF:   You have redness, swelling, or pain at the needle insertion site.   You have fluid, blood, or pus coming from the needle insertion site.   You feel light-headed, dizzy, or nauseated, and the feeling does not go away.   You notice new bruising at the needle insertion site.   You feel weaker than normal.   You have a fever or chills.  SEEK IMMEDIATE MEDICAL CARE IF:   You have severe nausea or vomiting.   You have chest pain.   You have trouble breathing.    This information is not intended to replace advice given to you by your health care provider. Make sure you discuss any questions you have with your health care provider.    Document Released: 09/05/2010 Document Revised: 08/18/2014 Document Reviewed: 03/30/2014  Elsevier Interactive Patient Education 2016 [employer]

## 2015-10-05 ENCOUNTER — Ambulatory Visit (HOSPITAL_BASED_OUTPATIENT_CLINIC_OR_DEPARTMENT_OTHER): Payer: 59

## 2015-10-05 ENCOUNTER — Other Ambulatory Visit (HOSPITAL_BASED_OUTPATIENT_CLINIC_OR_DEPARTMENT_OTHER): Payer: 59

## 2015-10-05 LAB — CBC WITH DIFFERENTIAL/PLATELET
BASO%: 1.1 % (ref 0.0–2.0)
BASOS ABS: 0.1 10*3/uL (ref 0.0–0.1)
EOS ABS: 0.1 10*3/uL (ref 0.0–0.5)
EOS%: 3.3 % (ref 0.0–7.0)
HCT: 40.2 % (ref 38.4–49.9)
HGB: 13.5 g/dL (ref 13.0–17.1)
LYMPH%: 36 % (ref 14.0–49.0)
MCH: 31.4 pg (ref 27.2–33.4)
MCHC: 33.5 g/dL (ref 32.0–36.0)
MCV: 93.7 fL (ref 79.3–98.0)
MONO#: 0.4 10*3/uL (ref 0.1–0.9)
MONO%: 7.9 % (ref 0.0–14.0)
NEUT#: 2.3 10*3/uL (ref 1.5–6.5)
NEUT%: 51.7 % (ref 39.0–75.0)
Platelets: 211 10*3/uL (ref 140–400)
RBC: 4.29 10*6/uL (ref 4.20–5.82)
RDW: 13 % (ref 11.0–14.6)
WBC: 4.4 10*3/uL (ref 4.0–10.3)
lymph#: 1.6 10*3/uL (ref 0.9–3.3)

## 2015-10-05 LAB — FERRITIN: FERRITIN: 45 ng/mL (ref 22–316)

## 2015-10-05 LAB — IRON AND TIBC
%SAT: 13 % — ABNORMAL LOW (ref 20–55)
Iron: 41 ug/dL — ABNORMAL LOW (ref 42–163)
TIBC: 316 ug/dL (ref 202–409)
UIBC: 275 ug/dL (ref 117–376)

## 2015-10-05 NOTE — Patient Instructions (Signed)

## 2015-10-18 ENCOUNTER — Other Ambulatory Visit: Payer: Self-pay

## 2015-10-18 DIAGNOSIS — R7989 Other specified abnormal findings of blood chemistry: Secondary | ICD-10-CM

## 2015-10-19 NOTE — Assessment & Plan Note (Signed)
06/16/2015 revealed Serum iron 188, iron saturation 77%, ferritin 1683, AST 62, ALT 103 MRI Liver 06/24/2015: No MRI evidence of iron deposition within the liver, spleen, marrow. 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

## 2015-10-20 ENCOUNTER — Other Ambulatory Visit (HOSPITAL_BASED_OUTPATIENT_CLINIC_OR_DEPARTMENT_OTHER): Payer: 59

## 2015-10-20 ENCOUNTER — Encounter: Payer: Self-pay | Admitting: Hematology and Oncology

## 2015-10-20 ENCOUNTER — Ambulatory Visit (HOSPITAL_BASED_OUTPATIENT_CLINIC_OR_DEPARTMENT_OTHER): Payer: 59 | Admitting: Hematology and Oncology

## 2015-10-20 ENCOUNTER — Telehealth: Payer: Self-pay | Admitting: Hematology and Oncology

## 2015-10-20 DIAGNOSIS — C7A012 Malignant carcinoid tumor of the ileum: Secondary | ICD-10-CM

## 2015-10-20 DIAGNOSIS — R7989 Other specified abnormal findings of blood chemistry: Secondary | ICD-10-CM

## 2015-10-20 LAB — CBC WITH DIFFERENTIAL/PLATELET
BASO%: 0.9 % (ref 0.0–2.0)
BASOS ABS: 0 10*3/uL (ref 0.0–0.1)
EOS ABS: 0.1 10*3/uL (ref 0.0–0.5)
EOS%: 2.5 % (ref 0.0–7.0)
HCT: 39.4 % (ref 38.4–49.9)
HGB: 13.2 g/dL (ref 13.0–17.1)
LYMPH%: 23.8 % (ref 14.0–49.0)
MCH: 30.7 pg (ref 27.2–33.4)
MCHC: 33.4 g/dL (ref 32.0–36.0)
MCV: 91.8 fL (ref 79.3–98.0)
MONO#: 0.4 10*3/uL (ref 0.1–0.9)
MONO%: 7.1 % (ref 0.0–14.0)
NEUT#: 3.7 10*3/uL (ref 1.5–6.5)
NEUT%: 65.7 % (ref 39.0–75.0)
Platelets: 176 10*3/uL (ref 140–400)
RBC: 4.3 10*6/uL (ref 4.20–5.82)
RDW: 12.7 % (ref 11.0–14.6)
WBC: 5.6 10*3/uL (ref 4.0–10.3)
lymph#: 1.3 10*3/uL (ref 0.9–3.3)

## 2015-10-20 LAB — IRON AND TIBC
%SAT: 11 % — ABNORMAL LOW (ref 20–55)
IRON: 36 ug/dL — AB (ref 42–163)
TIBC: 335 ug/dL (ref 202–409)
UIBC: 299 ug/dL (ref 117–376)

## 2015-10-20 LAB — FERRITIN: FERRITIN: 38 ng/mL (ref 22–316)

## 2015-10-20 NOTE — Progress Notes (Signed)
Patient Care Team: [place] as PCP - General (Family Medicine)  DIAGNOSIS:  1. Hereditary hemochromatosis gets weekly phlebotomy 2. 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 2 weeks ago he had a ferritin of 45 and a 13% saturation. Our goals were met. Our plan is to move him to once a month phlebotomies.  REVIEW OF SYSTEMS:   Constitutional: Denies fevers, chills or abnormal weight loss Eyes: Denies blurriness of vision Ears, nose, mouth, throat, and face: Denies mucositis or sore throat Respiratory: Denies cough, dyspnea or wheezes Cardiovascular: Denies palpitation, chest discomfort Gastrointestinal:  Denies nausea, heartburn or change in bowel habits Skin: Denies abnormal skin rashes Lymphatics: Denies new lymphadenopathy or easy bruising Neurological:Denies numbness, tingling or new weaknesses Behavioral/Psych: Mood is stable, no new changes  Extremities: No lower extremity edema  All other systems were reviewed with the patient and are negative.  I have reviewed the past medical history, past surgical history, social history and family history with the patient and they are unchanged from previous note.  ALLERGIES:  has No Known Allergies.  MEDICATIONS:  Current Outpatient Prescriptions  Medication Sig Dispense Refill  . lisinopril (PRINIVIL,ZESTRIL) 10 MG tablet Take 10 mg by mouth daily.     . sildenafil (VIAGRA) 100 MG tablet Take 50 mg by mouth as needed for erectile dysfunction.      No current facility-administered medications for this visit.    PHYSICAL EXAMINATION: ECOG PERFORMANCE STATUS: 1 - Symptomatic but completely ambulatory  Filed Vitals:   10/20/15 0845  BP:  161/81  Pulse: 80  Temp: 98.6 F (37 C)  Resp: 19   Filed Weights   10/20/15 0845  Weight: 198 lb 9.6 oz (90.084 kg)    GENERAL:alert, no distress and comfortable SKIN: skin color, texture, turgor are normal, no rashes or significant lesions EYES: normal, Conjunctiva are pink and non-injected, sclera clear OROPHARYNX:no exudate, no erythema and lips, buccal mucosa, and tongue normal  NECK: supple, thyroid normal size, non-tender, without nodularity LYMPH:  no palpable lymphadenopathy in the cervical, axillary or inguinal LUNGS: clear to auscultation and percussion with normal breathing effort HEART: regular rate & rhythm and no murmurs and no lower extremity edema ABDOMEN:abdomen soft, non-tender and normal bowel sounds MUSCULOSKELETAL:no cyanosis of digits and no clubbing  NEURO: alert & oriented x 3 with fluent speech, no focal motor/sensory deficits EXTREMITIES: No lower extremity edema  LABORATORY DATA:  I have reviewed the data as listed   Chemistry      Component Value Date/Time   NA 140 09/21/2015 0940   NA 141 08/13/2015 0355   K 4.8 09/21/2015 0940   K 4.6 08/13/2015 0355   CL 109 08/13/2015 0355   CO2 26 09/21/2015 0940   CO2 24 08/13/2015 0355   BUN 10.9 09/21/2015 0940   BUN 10 08/13/2015 0355   CREATININE 1.1 09/21/2015 0940   CREATININE 0.96 08/13/2015 0355      Component Value Date/Time   CALCIUM 9.5 09/21/2015 0940   CALCIUM 8.5* 08/13/2015 0355   ALKPHOS 68 09/21/2015 0940   AST 19 09/21/2015 0940   ALT 25 09/21/2015 0940   BILITOT 0.63 09/21/2015 0940       Lab Results  Component Value Date   WBC 5.6 10/20/2015  HGB 13.2 10/20/2015   HCT 39.4 10/20/2015   MCV 91.8 10/20/2015   PLT 176 10/20/2015   NEUTROABS 3.7 10/20/2015     ASSESSMENT & PLAN:  Hemochromatosis, hereditary ([place]) 06/16/2015 revealed Serum iron 188, iron saturation 77%, ferritin 1683, AST 62, ALT 103 MRI Liver 06/24/2015: No MRI evidence of iron deposition within the  liver, spleen, marrow. Diffuse hepatic steatosis MRI liver ([facility]): T2 measuring 10.2 ms, stage 1-2 fibrosis and evidence of iron overload  Current treatment: Phlebotomy with target ferritin below 50 and iron saturation below 45% started 06/18/2015  Plan: phlebotomy weekly x 4, then every 2 week phlebotomies, now switching to once a month Iron studies done 2 weeks ago revealed a ferritin of 45, iron saturation 14% Change Phlebotomy to once every 4 weeks Gout: resolved So we will cancel phlebotomy from today and repeat ferritin levels to be checked in 2 weeks on Monday and if the ferritin is greater than 50 then we will plan to do a phlebotomy on Tuesday.  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   The patient has a good understanding of the overall plan. he agrees with it. he will call with any problems that may develop before the next visit here.   Rulon Eisenmenger, MD 10/20/2015

## 2015-10-20 NOTE — Telephone Encounter (Signed)
per pof to sch pt appt-gave pt copy of avs °

## 2015-11-01 ENCOUNTER — Telehealth: Payer: Self-pay | Admitting: *Deleted

## 2015-11-01 ENCOUNTER — Other Ambulatory Visit (HOSPITAL_BASED_OUTPATIENT_CLINIC_OR_DEPARTMENT_OTHER): Payer: 59

## 2015-11-01 DIAGNOSIS — R7989 Other specified abnormal findings of blood chemistry: Secondary | ICD-10-CM

## 2015-11-01 LAB — CBC WITH DIFFERENTIAL/PLATELET
BASO%: 0.9 % (ref 0.0–2.0)
Basophils Absolute: 0 10*3/uL (ref 0.0–0.1)
EOS ABS: 0.2 10*3/uL (ref 0.0–0.5)
EOS%: 3.3 % (ref 0.0–7.0)
HEMATOCRIT: 44.9 % (ref 38.4–49.9)
HEMOGLOBIN: 15 g/dL (ref 13.0–17.1)
LYMPH#: 1.9 10*3/uL (ref 0.9–3.3)
LYMPH%: 38.7 % (ref 14.0–49.0)
MCH: 30.5 pg (ref 27.2–33.4)
MCHC: 33.4 g/dL (ref 32.0–36.0)
MCV: 91.5 fL (ref 79.3–98.0)
MONO#: 0.5 10*3/uL (ref 0.1–0.9)
MONO%: 10.2 % (ref 0.0–14.0)
NEUT%: 46.9 % (ref 39.0–75.0)
NEUTROS ABS: 2.3 10*3/uL (ref 1.5–6.5)
PLATELETS: 180 10*3/uL (ref 140–400)
RBC: 4.9 10*6/uL (ref 4.20–5.82)
RDW: 12.8 % (ref 11.0–14.6)
WBC: 4.9 10*3/uL (ref 4.0–10.3)

## 2015-11-01 LAB — FERRITIN: FERRITIN: 37 ng/mL (ref 22–316)

## 2015-11-01 LAB — IRON AND TIBC
%SAT: 40 % (ref 20–55)
Iron: 136 ug/dL (ref 42–163)
TIBC: 336 ug/dL (ref 202–409)
UIBC: 200 ug/dL (ref 117–376)

## 2015-11-01 NOTE — Telephone Encounter (Addendum)
"  I need to know my Ferritin level so I know if I need Phlebotomy tomorrow."  Ferritin = 37, Sat =n 40 %.  "Will you cancel my phlebotomy for tomorrow."  Will notify provider of this request.   No new note see 11-30-2015

## 2015-11-30 ENCOUNTER — Other Ambulatory Visit: Payer: Self-pay

## 2015-11-30 ENCOUNTER — Telehealth: Payer: Self-pay | Admitting: *Deleted

## 2015-11-30 NOTE — Telephone Encounter (Addendum)
"  I need to know when I am due for labs.  My ferritin level improved but I still need labs drawn to keep abreast on things for phlebotomy.   11-01-2015 was last Ferritin, TIBC, CBC-diff.  Return number [phone].

## 2015-12-03 ENCOUNTER — Other Ambulatory Visit (HOSPITAL_BASED_OUTPATIENT_CLINIC_OR_DEPARTMENT_OTHER): Payer: 59

## 2015-12-03 DIAGNOSIS — R7989 Other specified abnormal findings of blood chemistry: Secondary | ICD-10-CM

## 2015-12-03 LAB — CBC WITH DIFFERENTIAL/PLATELET
BASO%: 0.7 % (ref 0.0–2.0)
BASOS ABS: 0 10*3/uL (ref 0.0–0.1)
EOS ABS: 0.1 10*3/uL (ref 0.0–0.5)
EOS%: 3.2 % (ref 0.0–7.0)
HEMATOCRIT: 47.4 % (ref 38.4–49.9)
HEMOGLOBIN: 15.8 g/dL (ref 13.0–17.1)
LYMPH%: 46.3 % (ref 14.0–49.0)
MCH: 30.4 pg (ref 27.2–33.4)
MCHC: 33.3 g/dL (ref 32.0–36.0)
MCV: 91.4 fL (ref 79.3–98.0)
MONO#: 0.3 10*3/uL (ref 0.1–0.9)
MONO%: 6.9 % (ref 0.0–14.0)
NEUT#: 2 10*3/uL (ref 1.5–6.5)
NEUT%: 42.9 % (ref 39.0–75.0)
Platelets: 176 10*3/uL (ref 140–400)
RBC: 5.19 10*6/uL (ref 4.20–5.82)
RDW: 14.6 % (ref 11.0–14.6)
WBC: 4.6 10*3/uL (ref 4.0–10.3)
lymph#: 2.1 10*3/uL (ref 0.9–3.3)

## 2015-12-03 LAB — IRON AND TIBC
%SAT: 32 % (ref 20–55)
Iron: 97 ug/dL (ref 42–163)
TIBC: 300 ug/dL (ref 202–409)
UIBC: 203 ug/dL (ref 117–376)

## 2015-12-03 LAB — FERRITIN: Ferritin: 59 ng/ml (ref 22–316)

## 2015-12-07 ENCOUNTER — Telehealth: Payer: Self-pay

## 2015-12-07 NOTE — Telephone Encounter (Signed)
Gave pt ferritin level 59, pt questioned need for phlebotomy. Per Dr Lindi Adie note goal for ferritin is below 50. Pt was appreciative of information.

## 2015-12-08 ENCOUNTER — Ambulatory Visit (HOSPITAL_BASED_OUTPATIENT_CLINIC_OR_DEPARTMENT_OTHER): Payer: 59

## 2015-12-08 VITALS — BP 119/78 | HR 79 | Temp 97.8°F | Resp 18

## 2015-12-08 DIAGNOSIS — R7989 Other specified abnormal findings of blood chemistry: Secondary | ICD-10-CM

## 2015-12-08 NOTE — Patient Instructions (Signed)
Therapeutic Phlebotomy, Care After  Refer to this sheet in the next few weeks. These instructions provide you with information about caring for yourself after your procedure. Your health care provider may also give you more specific instructions. Your treatment has been planned according to current medical practices, but problems sometimes occur. Call your health care provider if you have any problems or questions after your procedure.  WHAT TO EXPECT AFTER THE PROCEDURE  After your procedure, it is common to have:   Light-headedness or dizziness. You may feel faint.   Nausea.   Tiredness.  HOME CARE INSTRUCTIONS  Activities   Return to your normal activities as directed by your health care provider. Most people can go back to their normal activities right away.   Avoid strenuous physical activity and heavy lifting or pulling for about 5 hours after the procedure. Do not lift anything that is heavier than 10 lb (4.5 kg).   Athletes should avoid strenuous exercise for at least 12 hours.   Change positions slowly for the remainder of the day. This will help to prevent light-headedness or fainting.   If you feel light-headed, lie down until the feeling goes away.  Eating and Drinking   Be sure to eat well-balanced meals for the next 24 hours.   Drink enough fluid to keep your urine clear or pale yellow.   Avoid drinking alcohol on the day that you had the procedure.  Care of the Needle Insertion Site   Keep your bandage dry. You can remove the bandage after about 5 hours or as directed by your health care provider.   If you have bleeding from the needle insertion site, elevate your arm and press firmly on the site until the bleeding stops.   If you have bruising at the site, apply ice to the area:   Put ice in a plastic bag.   Place a towel between your skin and the bag.   Leave the ice on for 20 minutes, 2-3 times a day for the first 24 hours.   If the swelling does not go away after 24 hours, apply  a warm, moist washcloth to the area for 20 minutes, 2-3 times a day.  General Instructions   Avoid smoking for at least 30 minutes after the procedure.   Keep all follow-up visits as directed by your health care provider. It is important to continue with further therapeutic phlebotomy treatments as directed.  SEEK MEDICAL CARE IF:   You have redness, swelling, or pain at the needle insertion site.   You have fluid, blood, or pus coming from the needle insertion site.   You feel light-headed, dizzy, or nauseated, and the feeling does not go away.   You notice new bruising at the needle insertion site.   You feel weaker than normal.   You have a fever or chills.  SEEK IMMEDIATE MEDICAL CARE IF:   You have severe nausea or vomiting.   You have chest pain.   You have trouble breathing.    This information is not intended to replace advice given to you by your health care provider. Make sure you discuss any questions you have with your health care provider.    Document Released: 09/05/2010 Document Revised: 08/18/2014 Document Reviewed: 03/30/2014  Elsevier Interactive Patient Education 2016 [employer]

## 2015-12-08 NOTE — Progress Notes (Signed)
[id number]: Ferritin 59 on 12/03/15, per Dr. Geralyn Flash office note on 10/20/15 goal is ferritin below 50. Therapeutic phlebotomy performed per MD order using a 16 gauge phlebotomy set in left AC starting at 0901 and ending at 0911 removing 531 grams of blood. Pt tolerated procedure well and drink provided, pt refused snack.  0925: pt refused to stay full 30 minute post observation. Pt and VS stable at time of discharge.

## 2015-12-31 ENCOUNTER — Other Ambulatory Visit (HOSPITAL_BASED_OUTPATIENT_CLINIC_OR_DEPARTMENT_OTHER): Payer: 59

## 2015-12-31 DIAGNOSIS — R7989 Other specified abnormal findings of blood chemistry: Secondary | ICD-10-CM

## 2015-12-31 LAB — IRON AND TIBC
%SAT: 49 % (ref 20–55)
Iron: 146 ug/dL (ref 42–163)
TIBC: 300 ug/dL (ref 202–409)
UIBC: 154 ug/dL (ref 117–376)

## 2015-12-31 LAB — CBC WITH DIFFERENTIAL/PLATELET
BASO%: 0.7 % (ref 0.0–2.0)
Basophils Absolute: 0 10*3/uL (ref 0.0–0.1)
EOS%: 3.2 % (ref 0.0–7.0)
Eosinophils Absolute: 0.1 10*3/uL (ref 0.0–0.5)
HCT: 44.9 % (ref 38.4–49.9)
HGB: 15.8 g/dL (ref 13.0–17.1)
LYMPH%: 44.1 % (ref 14.0–49.0)
MCH: 31.5 pg (ref 27.2–33.4)
MCHC: 35.2 g/dL (ref 32.0–36.0)
MCV: 89.6 fL (ref 79.3–98.0)
MONO#: 0.3 10*3/uL (ref 0.1–0.9)
MONO%: 6.1 % (ref 0.0–14.0)
NEUT#: 2 10*3/uL (ref 1.5–6.5)
NEUT%: 45.9 % (ref 39.0–75.0)
Platelets: 164 10*3/uL (ref 140–400)
RBC: 5.01 10*6/uL (ref 4.20–5.82)
RDW: 14 % (ref 11.0–14.6)
WBC: 4.4 10*3/uL (ref 4.0–10.3)
lymph#: 1.9 10*3/uL (ref 0.9–3.3)

## 2015-12-31 LAB — FERRITIN: Ferritin: 58 ng/ml (ref 22–316)

## 2016-01-18 ENCOUNTER — Other Ambulatory Visit: Payer: Self-pay | Admitting: General Surgery

## 2016-01-18 DIAGNOSIS — R1900 Intra-abdominal and pelvic swelling, mass and lump, unspecified site: Secondary | ICD-10-CM

## 2016-01-26 ENCOUNTER — Ambulatory Visit
Admission: RE | Admit: 2016-01-26 | Discharge: 2016-01-26 | Disposition: A | Discharge status: Home / Self Care | Payer: 59 | Source: Ambulatory Visit | Attending: General Surgery | Admitting: General Surgery

## 2016-01-26 DIAGNOSIS — R1900 Intra-abdominal and pelvic swelling, mass and lump, unspecified site: Secondary | ICD-10-CM

## 2016-01-26 MED ORDER — IOPAMIDOL (ISOVUE-300) INJECTION 61%
125.0000 mL | Freq: Once | INTRAVENOUS | Status: AC | PRN
  Administered 2016-01-26: 125 mL via INTRAVENOUS

## 2016-01-28 ENCOUNTER — Telehealth: Payer: Self-pay | Admitting: Hematology and Oncology

## 2016-01-28 NOTE — Telephone Encounter (Signed)
Patient called to [place] appointments[place]. Patient will call back to reschedule, once he has returned to the [place] 01/28/16

## 2016-02-03 ENCOUNTER — Other Ambulatory Visit: Payer: 59

## 2016-02-03 ENCOUNTER — Ambulatory Visit: Payer: 59 | Admitting: Hematology and Oncology

## 2016-02-07 ENCOUNTER — Telehealth: Payer: Self-pay | Admitting: Hematology and Oncology

## 2016-02-07 NOTE — Telephone Encounter (Signed)
Patient called to have 10/19 lab appointment rescheduled to 10/25.

## 2016-02-09 ENCOUNTER — Other Ambulatory Visit (HOSPITAL_BASED_OUTPATIENT_CLINIC_OR_DEPARTMENT_OTHER): Payer: 59

## 2016-02-09 DIAGNOSIS — R7989 Other specified abnormal findings of blood chemistry: Secondary | ICD-10-CM

## 2016-02-09 LAB — CBC WITH DIFFERENTIAL/PLATELET
BASO%: 0.7 % (ref 0.0–2.0)
Basophils Absolute: 0 10*3/uL (ref 0.0–0.1)
EOS%: 3 % (ref 0.0–7.0)
Eosinophils Absolute: 0.1 10*3/uL (ref 0.0–0.5)
HCT: 47.4 % (ref 38.4–49.9)
HGB: 16.1 g/dL (ref 13.0–17.1)
LYMPH%: 33.9 % (ref 14.0–49.0)
MCH: 31.9 pg (ref 27.2–33.4)
MCHC: 34 g/dL (ref 32.0–36.0)
MCV: 93.8 fL (ref 79.3–98.0)
MONO#: 0.4 10*3/uL (ref 0.1–0.9)
MONO%: 9.3 % (ref 0.0–14.0)
NEUT#: 2.5 10*3/uL (ref 1.5–6.5)
NEUT%: 53.1 % (ref 39.0–75.0)
Platelets: 170 10*3/uL (ref 140–400)
RBC: 5.05 10*6/uL (ref 4.20–5.82)
RDW: 15.7 % — ABNORMAL HIGH (ref 11.0–14.6)
WBC: 4.8 10*3/uL (ref 4.0–10.3)
lymph#: 1.6 10*3/uL (ref 0.9–3.3)

## 2016-02-09 LAB — IRON AND TIBC
%SAT: 55 % (ref 20–55)
IRON: 158 ug/dL (ref 42–163)
TIBC: 289 ug/dL (ref 202–409)
UIBC: 130 ug/dL (ref 117–376)

## 2016-02-09 LAB — FERRITIN: FERRITIN: 171 ng/mL (ref 22–316)

## 2016-02-16 ENCOUNTER — Other Ambulatory Visit: Payer: Self-pay | Admitting: General Surgery

## 2016-03-01 ENCOUNTER — Telehealth: Payer: Self-pay | Admitting: Hematology and Oncology

## 2016-03-01 NOTE — Telephone Encounter (Signed)
Patient called to have lab appointment rescheduled to 11/20, asked to have 10/19 MD appointment rescheduled as well.

## 2016-03-03 ENCOUNTER — Other Ambulatory Visit: Payer: 59

## 2016-03-05 NOTE — Assessment & Plan Note (Addendum)
06/16/2015 revealed Serum iron 188, iron saturation 77%, ferritin 1683, AST 62, ALT 103 MRI Liver 06/24/2015: No MRI evidence of iron deposition within the liver, spleen, marrow. 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

## 2016-03-06 ENCOUNTER — Other Ambulatory Visit (HOSPITAL_BASED_OUTPATIENT_CLINIC_OR_DEPARTMENT_OTHER): Payer: 59

## 2016-03-06 DIAGNOSIS — R7989 Other specified abnormal findings of blood chemistry: Secondary | ICD-10-CM

## 2016-03-06 LAB — IRON AND TIBC
%SAT: 39 % (ref 20–55)
Iron: 112 ug/dL (ref 42–163)
TIBC: 288 ug/dL (ref 202–409)
UIBC: 176 ug/dL (ref 117–376)

## 2016-03-06 LAB — CBC WITH DIFFERENTIAL/PLATELET
BASO%: 0.7 % (ref 0.0–2.0)
BASOS ABS: 0 10*3/uL (ref 0.0–0.1)
EOS ABS: 0.2 10*3/uL (ref 0.0–0.5)
EOS%: 3.7 % (ref 0.0–7.0)
HCT: 45.6 % (ref 38.4–49.9)
HGB: 15.7 g/dL (ref 13.0–17.1)
LYMPH%: 36.9 % (ref 14.0–49.0)
MCH: 33.4 pg (ref 27.2–33.4)
MCHC: 34.5 g/dL (ref 32.0–36.0)
MCV: 96.9 fL (ref 79.3–98.0)
MONO#: 0.5 10*3/uL (ref 0.1–0.9)
MONO%: 10.1 % (ref 0.0–14.0)
NEUT#: 2.6 10*3/uL (ref 1.5–6.5)
NEUT%: 48.6 % (ref 39.0–75.0)
Platelets: 171 10*3/uL (ref 140–400)
RBC: 4.7 10*6/uL (ref 4.20–5.82)
RDW: 14 % (ref 11.0–14.6)
WBC: 5.3 10*3/uL (ref 4.0–10.3)
lymph#: 2 10*3/uL (ref 0.9–3.3)

## 2016-03-06 LAB — FERRITIN: Ferritin: 311 ng/mL (ref 22–316)

## 2016-03-07 ENCOUNTER — Ambulatory Visit (HOSPITAL_BASED_OUTPATIENT_CLINIC_OR_DEPARTMENT_OTHER): Payer: 59 | Admitting: Hematology and Oncology

## 2016-03-07 ENCOUNTER — Encounter: Payer: Self-pay | Admitting: Hematology and Oncology

## 2016-03-07 ENCOUNTER — Ambulatory Visit (HOSPITAL_BASED_OUTPATIENT_CLINIC_OR_DEPARTMENT_OTHER): Payer: 59

## 2016-03-07 VITALS — BP 134/92 | HR 85 | Temp 98.3°F | Resp 16

## 2016-03-07 DIAGNOSIS — C7A012 Malignant carcinoid tumor of the ileum: Secondary | ICD-10-CM

## 2016-03-07 DIAGNOSIS — R7989 Other specified abnormal findings of blood chemistry: Secondary | ICD-10-CM

## 2016-03-07 DIAGNOSIS — K760 Fatty (change of) liver, not elsewhere classified: Secondary | ICD-10-CM

## 2016-03-07 NOTE — Progress Notes (Signed)
Patient Care Team: [place] as PCP - General (Family Medicine)  DIAGNOSIS:  1. Hereditary hemochromatosis gets weekly phlebotomy 2. 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 His last phlebotomy was on 12/08/2015. He has been traveling and has not made appointments to get more phlebotomies. His ferritin has rapidly climbed up. Overall he has no new symptoms.   REVIEW OF SYSTEMS:   Constitutional: Denies fevers, chills or abnormal weight loss Eyes: Denies blurriness of vision Ears, nose, mouth, throat, and face: Denies mucositis or sore throat Respiratory: Denies cough, dyspnea or wheezes Cardiovascular: Denies palpitation, chest discomfort Gastrointestinal:  Denies nausea, heartburn or change in bowel habits Skin: Denies abnormal skin rashes Lymphatics: Denies new lymphadenopathy or easy bruising Neurological:Denies numbness, tingling or new weaknesses Behavioral/Psych: Mood is stable, no new changes  Extremities: No lower extremity edema  All other systems were reviewed with the patient and are negative.  I have reviewed the past medical history, past surgical history, social history and family history with the patient and they are unchanged from previous note.  ALLERGIES:  has No Known Allergies.  MEDICATIONS:  Current Outpatient Prescriptions  Medication Sig Dispense Refill  . lisinopril (PRINIVIL,ZESTRIL) 10 MG tablet Take 10 mg by mouth daily.     . sildenafil (VIAGRA) 100 MG tablet Take 50 mg by mouth as needed for erectile dysfunction.      No current facility-administered medications for this visit.     PHYSICAL EXAMINATION: ECOG PERFORMANCE STATUS: 1 - Symptomatic but completely ambulatory  Vitals:   03/07/16  0935  BP: 130/81  Pulse: 77  Resp: 18  Temp: 98.1 F (36.7 C)   Filed Weights   03/07/16 0935  Weight: 204 lb 2 oz (92.6 kg)    GENERAL:alert, no distress and comfortable SKIN: skin color, texture, turgor are normal, no rashes or significant lesions EYES: normal, Conjunctiva are pink and non-injected, sclera clear OROPHARYNX:no exudate, no erythema and lips, buccal mucosa, and tongue normal  NECK: supple, thyroid normal size, non-tender, without nodularity LYMPH:  no palpable lymphadenopathy in the cervical, axillary or inguinal LUNGS: clear to auscultation and percussion with normal breathing effort HEART: regular rate & rhythm and no murmurs and no lower extremity edema ABDOMEN:abdomen soft, non-tender and normal bowel sounds MUSCULOSKELETAL:no cyanosis of digits and no clubbing  NEURO: alert & oriented x 3 with fluent speech, no focal motor/sensory deficits EXTREMITIES: No lower extremity edema  LABORATORY DATA:  I have reviewed the data as listed   Chemistry      Component Value Date/Time   NA 140 09/21/2015 0940   K 4.8 09/21/2015 0940   CL 109 08/13/2015 0355   CO2 26 09/21/2015 0940   BUN 10.9 09/21/2015 0940   CREATININE 1.1 09/21/2015 0940      Component Value Date/Time   CALCIUM 9.5 09/21/2015 0940   ALKPHOS 68 09/21/2015 0940   AST 19 09/21/2015 0940   ALT 25 09/21/2015 0940   BILITOT 0.63 09/21/2015 0940       Lab Results  Component Value Date   WBC 5.3 03/06/2016   HGB 15.7 03/06/2016   HCT 45.6 03/06/2016   MCV 96.9 03/06/2016   PLT 171 03/06/2016   NEUTROABS 2.6 03/06/2016     ASSESSMENT & PLAN:  Hemochromatosis, hereditary ([place]) 06/16/2015 revealed Serum iron 188, iron  saturation 77%, ferritin 1683, AST 62, ALT 103 MRI Liver 06/24/2015: No MRI evidence of iron deposition within the liver, spleen, marrow. 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 He has a stress test coming up. Fatty liver: I instructed him to exercise frequently and to reduce his fat intake. He is likely to start a cholesterol medication.  I will see him back in 3 months for follow-up. No orders of the defined types were placed in this encounter.  The patient has a good understanding of the overall plan. he agrees with it. he will call with any problems that may develop before the next visit here.   Rulon Eisenmenger, MD 03/07/16

## 2016-03-07 NOTE — Progress Notes (Signed)
Pt to get phlebotomy treatment today per Dr. Lindi Adie

## 2016-03-07 NOTE — Patient Instructions (Signed)

## 2016-03-31 ENCOUNTER — Other Ambulatory Visit (HOSPITAL_BASED_OUTPATIENT_CLINIC_OR_DEPARTMENT_OTHER): Payer: 59

## 2016-03-31 DIAGNOSIS — R7989 Other specified abnormal findings of blood chemistry: Secondary | ICD-10-CM

## 2016-03-31 LAB — CBC WITH DIFFERENTIAL/PLATELET
BASO%: 0.5 % (ref 0.0–2.0)
BASOS ABS: 0 10*3/uL (ref 0.0–0.1)
EOS ABS: 0.2 10*3/uL (ref 0.0–0.5)
EOS%: 3.4 % (ref 0.0–7.0)
HCT: 43.4 % (ref 38.4–49.9)
HEMOGLOBIN: 15.5 g/dL (ref 13.0–17.1)
LYMPH%: 32.5 % (ref 14.0–49.0)
MCH: 34.1 pg — AB (ref 27.2–33.4)
MCHC: 35.7 g/dL (ref 32.0–36.0)
MCV: 95.6 fL (ref 79.3–98.0)
MONO#: 0.5 10*3/uL (ref 0.1–0.9)
MONO%: 9 % (ref 0.0–14.0)
NEUT%: 54.6 % (ref 39.0–75.0)
NEUTROS ABS: 3 10*3/uL (ref 1.5–6.5)
PLATELETS: 172 10*3/uL (ref 140–400)
RBC: 4.54 10*6/uL (ref 4.20–5.82)
RDW: 12.1 % (ref 11.0–14.6)
WBC: 5.5 10*3/uL (ref 4.0–10.3)
lymph#: 1.8 10*3/uL (ref 0.9–3.3)

## 2016-03-31 LAB — IRON AND TIBC
%SAT: 87 % — ABNORMAL HIGH (ref 20–55)
IRON: 237 ug/dL — AB (ref 42–163)
TIBC: 273 ug/dL (ref 202–409)
UIBC: 36 ug/dL — ABNORMAL LOW (ref 117–376)

## 2016-03-31 LAB — FERRITIN: Ferritin: 137 ng/ml (ref 22–316)

## 2016-04-11 ENCOUNTER — Other Ambulatory Visit: Payer: Self-pay | Admitting: Cardiology

## 2016-04-11 DIAGNOSIS — E7800 Pure hypercholesterolemia, unspecified: Secondary | ICD-10-CM

## 2016-04-11 DIAGNOSIS — E785 Hyperlipidemia, unspecified: Secondary | ICD-10-CM | POA: Insufficient documentation

## 2016-04-18 ENCOUNTER — Other Ambulatory Visit (HOSPITAL_BASED_OUTPATIENT_CLINIC_OR_DEPARTMENT_OTHER): Payer: 59

## 2016-04-18 ENCOUNTER — Ambulatory Visit (HOSPITAL_BASED_OUTPATIENT_CLINIC_OR_DEPARTMENT_OTHER): Payer: 59

## 2016-04-18 VITALS — BP 130/98 | HR 75 | Temp 98.0°F | Resp 16

## 2016-04-18 DIAGNOSIS — R7989 Other specified abnormal findings of blood chemistry: Secondary | ICD-10-CM

## 2016-04-18 LAB — CBC WITH DIFFERENTIAL/PLATELET
BASO%: 0.3 % (ref 0.0–2.0)
BASOS ABS: 0 10*3/uL (ref 0.0–0.1)
EOS%: 3 % (ref 0.0–7.0)
Eosinophils Absolute: 0.2 10*3/uL (ref 0.0–0.5)
HCT: 44.5 % (ref 38.4–49.9)
HEMOGLOBIN: 16 g/dL (ref 13.0–17.1)
LYMPH%: 32.2 % (ref 14.0–49.0)
MCH: 34.2 pg — AB (ref 27.2–33.4)
MCHC: 36 g/dL (ref 32.0–36.0)
MCV: 95.1 fL (ref 79.3–98.0)
MONO#: 0.6 10*3/uL (ref 0.1–0.9)
MONO%: 10.1 % (ref 0.0–14.0)
NEUT#: 3.3 10*3/uL (ref 1.5–6.5)
NEUT%: 54.4 % (ref 39.0–75.0)
Platelets: 168 10*3/uL (ref 140–400)
RBC: 4.68 10*6/uL (ref 4.20–5.82)
RDW: 11.9 % (ref 11.0–14.6)
WBC: 6.1 10*3/uL (ref 4.0–10.3)
lymph#: 2 10*3/uL (ref 0.9–3.3)

## 2016-04-18 LAB — IRON AND TIBC
%SAT: 62 % — ABNORMAL HIGH (ref 20–55)
Iron: 182 ug/dL — ABNORMAL HIGH (ref 42–163)
TIBC: 291 ug/dL (ref 202–409)
UIBC: 109 ug/dL — AB (ref 117–376)

## 2016-04-18 LAB — FERRITIN: Ferritin: 138 ng/ml (ref 22–316)

## 2016-04-18 NOTE — Progress Notes (Signed)
Phlebotomy started at 0907 with 16g phlebotomy kit to the left AC. 514 grams removed and phlebotomy ended at 0917. Snack offered and patient refused. Patient refused to stay for 30 minutes post observation. Patient and vital signs stable upon discharge.

## 2016-04-18 NOTE — Patient Instructions (Signed)
Therapeutic Phlebotomy Therapeutic phlebotomy is the controlled removal of blood from a person's body for the purpose of treating a medical condition. The procedure is similar to donating blood. Usually, about a pint (470 mL, or 0.47L) of blood is removed. The average adult has 9-12 pints (4.3-5.7 L) of blood. Therapeutic phlebotomy may be used to treat the following medical conditions:  Hemochromatosis. This is a condition in which the blood contains too much iron.  Polycythemia vera. This is a condition in which the blood contains too many red blood cells.  Porphyria cutanea tarda. This is a disease in which an important part of hemoglobin is not made properly. It results in the buildup of abnormal amounts of porphyrins in the body.  Sickle cell disease. This is a condition in which the red blood cells form an abnormal crescent shape rather than a round shape. Tell a health care provider about:  Any allergies you have.  All medicines you are taking, including vitamins, herbs, eye drops, creams, and over-the-counter medicines.  Any problems you or family members have had with anesthetic medicines.  Any blood disorders you have.  Any surgeries you have had.  Any medical conditions you have. What are the risks? Generally, this is a safe procedure. However, problems may occur, including:  Nausea or light-headedness.  Low blood pressure.  Soreness, bleeding, swelling, or bruising at the needle insertion site.  Infection. What happens before the procedure?  Follow instructions from your health care provider about eating or drinking restrictions.  Ask your health care provider about changing or stopping your regular medicines. This is especially important if you are taking diabetes medicines or blood thinners.  Wear clothing with sleeves that can be raised above the elbow.  Plan to have someone take you home after the procedure.  You may have a blood sample taken. What happens  during the procedure?  A needle will be inserted into one of your veins.  Tubing and a collection bag will be attached to that needle.  Blood will flow through the needle and tubing into the collection bag.  You may be asked to open and close your hand slowly and continually during the entire collection.  After the specified amount of blood has been removed from your body, the collection bag and tubing will be clamped.  The needle will be removed from your vein.  Pressure will be held on the site of the needle insertion to stop the bleeding.  A bandage (dressing) will be placed over the needle insertion site. The procedure may vary among health care providers and hospitals. What happens after the procedure?  Your recovery will be assessed and monitored.  You can return to your normal activities as directed by your health care provider. This information is not intended to replace advice given to you by your health care provider. Make sure you discuss any questions you have with your health care provider. Document Released: 09/05/2010 Document Revised: 12/04/2015 Document Reviewed: 03/30/2014 Elsevier Interactive Patient Education  2017 [employer]  

## 2016-04-27 ENCOUNTER — Other Ambulatory Visit: Payer: Self-pay | Admitting: Cardiology

## 2016-04-27 DIAGNOSIS — E7800 Pure hypercholesterolemia, unspecified: Secondary | ICD-10-CM

## 2016-04-27 DIAGNOSIS — I10 Essential (primary) hypertension: Secondary | ICD-10-CM

## 2016-05-30 ENCOUNTER — Other Ambulatory Visit (HOSPITAL_BASED_OUTPATIENT_CLINIC_OR_DEPARTMENT_OTHER): Payer: 59

## 2016-05-30 ENCOUNTER — Ambulatory Visit (HOSPITAL_BASED_OUTPATIENT_CLINIC_OR_DEPARTMENT_OTHER): Payer: 59

## 2016-05-30 ENCOUNTER — Encounter: Payer: Self-pay | Admitting: Hematology and Oncology

## 2016-05-30 ENCOUNTER — Other Ambulatory Visit: Payer: Self-pay | Admitting: Hematology and Oncology

## 2016-05-30 ENCOUNTER — Ambulatory Visit (HOSPITAL_BASED_OUTPATIENT_CLINIC_OR_DEPARTMENT_OTHER): Payer: 59 | Admitting: Hematology and Oncology

## 2016-05-30 DIAGNOSIS — K760 Fatty (change of) liver, not elsewhere classified: Secondary | ICD-10-CM

## 2016-05-30 DIAGNOSIS — R7989 Other specified abnormal findings of blood chemistry: Secondary | ICD-10-CM

## 2016-05-30 DIAGNOSIS — C7A012 Malignant carcinoid tumor of the ileum: Secondary | ICD-10-CM

## 2016-05-30 DIAGNOSIS — I700 Atherosclerosis of aorta: Secondary | ICD-10-CM

## 2016-05-30 LAB — IRON AND TIBC
%SAT: 42 % (ref 20–55)
IRON: 116 ug/dL (ref 42–163)
TIBC: 276 ug/dL (ref 202–409)
UIBC: 161 ug/dL (ref 117–376)

## 2016-05-30 LAB — CBC WITH DIFFERENTIAL/PLATELET
BASO%: 0.4 % (ref 0.0–2.0)
BASOS ABS: 0 10*3/uL (ref 0.0–0.1)
EOS ABS: 0.2 10*3/uL (ref 0.0–0.5)
EOS%: 4.2 % (ref 0.0–7.0)
HCT: 43.3 % (ref 38.4–49.9)
HEMOGLOBIN: 15.4 g/dL (ref 13.0–17.1)
LYMPH%: 38.2 % (ref 14.0–49.0)
MCH: 33.3 pg (ref 27.2–33.4)
MCHC: 35.6 g/dL (ref 32.0–36.0)
MCV: 93.7 fL (ref 79.3–98.0)
MONO#: 0.4 10*3/uL (ref 0.1–0.9)
MONO%: 8.5 % (ref 0.0–14.0)
NEUT#: 2.4 10*3/uL (ref 1.5–6.5)
NEUT%: 48.7 % (ref 39.0–75.0)
Platelets: 165 10*3/uL (ref 140–400)
RBC: 4.62 10*6/uL (ref 4.20–5.82)
RDW: 11.8 % (ref 11.0–14.6)
WBC: 5 10*3/uL (ref 4.0–10.3)
lymph#: 1.9 10*3/uL (ref 0.9–3.3)

## 2016-05-30 LAB — FERRITIN: Ferritin: 143 ng/ml (ref 22–316)

## 2016-05-30 NOTE — Progress Notes (Signed)
Patient states he is unable to stay for post-procedure observation. Patient discharged without incident.

## 2016-05-30 NOTE — Assessment & Plan Note (Signed)
06/16/2015 revealed Serum iron 188, iron saturation 77%, ferritin 1683, AST 62, ALT 103 MRI Liver 06/24/2015: No MRI evidence of iron deposition within the liver, spleen, marrow. 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 He has a stress test coming up. Fatty liver: I instructed him to exercise frequently and to reduce his fat intake. He is likely to start a cholesterol medication.  I will see him back in 3 months for follow-up.

## 2016-05-30 NOTE — Progress Notes (Signed)
Patient Care Team: [place] as PCP - General (Family Medicine)  DIAGNOSIS:  Encounter Diagnosis  Name Primary?  . Hemochromatosis, hereditary ([place])     CHIEF COMPLIANT:  Follow-up of hemochromatosis  INTERVAL HISTORY: Tommy Barnett is a 56 year old with above-mentioned history of hereditary hemochromatosis was currently on phlebotomy schedule once every 6 weeks. He is tolerating phlebotomy treatments fairly well. He does not have any complaints or concerns today. He denies any headaches blurred vision lightheadedness or dizziness.  REVIEW OF SYSTEMS:   Constitutional: Denies fevers, chills or abnormal weight loss Eyes: Denies blurriness of vision Ears, nose, mouth, throat, and face: Denies mucositis or sore throat Respiratory: Denies cough, dyspnea or wheezes Cardiovascular: Denies palpitation, chest discomfort Gastrointestinal:  Denies nausea, heartburn or change in bowel habits Skin: Denies abnormal skin rashes Lymphatics: Denies new lymphadenopathy or easy bruising Neurological:Denies numbness, tingling or new weaknesses Behavioral/Psych: Mood is stable, no new changes  Extremities: No lower extremity edema  All other systems were reviewed with the patient and are negative.  I have reviewed the past medical history, past surgical history, social history and family history with the patient and they are unchanged from previous note.  ALLERGIES:  has No Known Allergies.  MEDICATIONS:  Current Outpatient Prescriptions  Medication Sig Dispense Refill  . lisinopril (PRINIVIL,ZESTRIL) 10 MG tablet Take 10 mg by mouth daily.     . sildenafil (VIAGRA) 100 MG tablet Take 50 mg by mouth as needed for erectile dysfunction.      No current facility-administered medications for this visit.     PHYSICAL EXAMINATION: ECOG PERFORMANCE STATUS: 1 - Symptomatic but completely ambulatory  Vitals:   05/30/16 0840  BP: (!) 146/80  Pulse: 70  Resp:  18  Temp: 98 F (36.7 C)   Filed Weights   05/30/16 0840  Weight: 208 lb 9.6 oz (94.6 kg)    GENERAL:alert, no distress and comfortable SKIN: skin color, texture, turgor are normal, no rashes or significant lesions EYES: normal, Conjunctiva are pink and non-injected, sclera clear OROPHARYNX:no exudate, no erythema and lips, buccal mucosa, and tongue normal  NECK: supple, thyroid normal size, non-tender, without nodularity LYMPH:  no palpable lymphadenopathy in the cervical, axillary or inguinal LUNGS: clear to auscultation and percussion with normal breathing effort HEART: regular rate & rhythm and no murmurs and no lower extremity edema ABDOMEN:abdomen soft, non-tender and normal bowel sounds MUSCULOSKELETAL:no cyanosis of digits and no clubbing  NEURO: alert & oriented x 3 with fluent speech, no focal motor/sensory deficits EXTREMITIES: No lower extremity edema  LABORATORY DATA:  I have reviewed the data as listed   Chemistry      Component Value Date/Time   NA 140 09/21/2015 0940   K 4.8 09/21/2015 0940   CL 109 08/13/2015 0355   CO2 26 09/21/2015 0940   BUN 10.9 09/21/2015 0940   CREATININE 1.1 09/21/2015 0940      Component Value Date/Time   CALCIUM 9.5 09/21/2015 0940   ALKPHOS 68 09/21/2015 0940   AST 19 09/21/2015 0940   ALT 25 09/21/2015 0940   BILITOT 0.63 09/21/2015 0940       Lab Results  Component Value Date   WBC 5.0 05/30/2016   HGB 15.4 05/30/2016   HCT 43.3 05/30/2016   MCV 93.7 05/30/2016   PLT 165 05/30/2016   NEUTROABS 2.4 05/30/2016    ASSESSMENT & PLAN:  Hemochromatosis, hereditary ([place]) 06/16/2015 revealed Serum iron 188, iron saturation 77%, ferritin 1683, AST 62,  ALT 103 MRI Liver 06/24/2015: No MRI evidence of iron deposition within the liver, spleen, marrow. Diffuse hepatic steatosis MRI liver [facility]): T2 measuring 10.2 ms, stage 1-2 fibrosis and evidence of iron overload  Current treatment: Phlebotomy with target ferritin  below 50 and iron saturation below 45% started 06/18/2015  Plan:  04/18/16 Iron studies reveal a ferritin of 138 iron saturation 62% Phlebotomy will be every 6 weeks (last on 04/18/16) We are awaiting today's blood work to determine if this phlebotomy can be done less often than every 6 weeks. 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  Fatty liver: I instructed him to exercise frequently and to reduce his fat intake. He started a cholesterol medication. I will see him back in 3 months for follow-up.  I spent 25 minutes talking to the patient of which more than half was spent in counseling and coordination of care.  No orders of the defined types were placed in this encounter.  The patient has a good understanding of the overall plan. he agrees with it. he will call with any problems that may develop before the next visit here.   Rulon Eisenmenger, MD 05/30/16

## 2016-05-30 NOTE — Patient Instructions (Signed)
Therapeutic Phlebotomy Therapeutic phlebotomy is the controlled removal of blood from a person's body for the purpose of treating a medical condition. The procedure is similar to donating blood. Usually, about a pint (470 mL, or 0.47L) of blood is removed. The average adult has 9-12 pints (4.3-5.7 L) of blood. Therapeutic phlebotomy may be used to treat the following medical conditions:  Hemochromatosis. This is a condition in which the blood contains too much iron.  Polycythemia vera. This is a condition in which the blood contains too many red blood cells.  Porphyria cutanea tarda. This is a disease in which an important part of hemoglobin is not made properly. It results in the buildup of abnormal amounts of porphyrins in the body.  Sickle cell disease. This is a condition in which the red blood cells form an abnormal crescent shape rather than a round shape. Tell a health care provider about:  Any allergies you have.  All medicines you are taking, including vitamins, herbs, eye drops, creams, and over-the-counter medicines.  Any problems you or family members have had with anesthetic medicines.  Any blood disorders you have.  Any surgeries you have had.  Any medical conditions you have. What are the risks? Generally, this is a safe procedure. However, problems may occur, including:  Nausea or light-headedness.  Low blood pressure.  Soreness, bleeding, swelling, or bruising at the needle insertion site.  Infection. What happens before the procedure?  Follow instructions from your health care provider about eating or drinking restrictions.  Ask your health care provider about changing or stopping your regular medicines. This is especially important if you are taking diabetes medicines or blood thinners.  Wear clothing with sleeves that can be raised above the elbow.  Plan to have someone take you home after the procedure.  You may have a blood sample taken. What happens  during the procedure?  A needle will be inserted into one of your veins.  Tubing and a collection bag will be attached to that needle.  Blood will flow through the needle and tubing into the collection bag.  You may be asked to open and close your hand slowly and continually during the entire collection.  After the specified amount of blood has been removed from your body, the collection bag and tubing will be clamped.  The needle will be removed from your vein.  Pressure will be held on the site of the needle insertion to stop the bleeding.  A bandage (dressing) will be placed over the needle insertion site. The procedure may vary among health care providers and hospitals. What happens after the procedure?  Your recovery will be assessed and monitored.  You can return to your normal activities as directed by your health care provider. This information is not intended to replace advice given to you by your health care provider. Make sure you discuss any questions you have with your health care provider. Document Released: 09/05/2010 Document Revised: 12/04/2015 Document Reviewed: 03/30/2014 Elsevier Interactive Patient Education  2017 [employer]  

## 2016-07-07 ENCOUNTER — Telehealth: Payer: Self-pay

## 2016-07-07 NOTE — Telephone Encounter (Signed)
Returned pt vm regarding needing to see Dr.Gudena next week during his lab/phlebotomy appt 3/27. Pt states that he will be relocating to [facility] April 3rd and wants to know about his results and need a referral in the [place] area. Told pt that Dr.Gudena is out of the office today. Will touch base with him on Monday and call pt back. Pt verbalized understanding. Told pt that may need to have appt at different day because labs will not result right away. Pt understands and okay to set up a different day next week.Cancelled future appts after 07/11/16

## 2016-07-10 ENCOUNTER — Telehealth: Payer: Self-pay | Admitting: Emergency Medicine

## 2016-07-10 NOTE — Telephone Encounter (Signed)
Patient requesting MD visit prior to him relocating to [facility]; Per Dr Lindi Adie; ok for patient to see him on 3/29. Spoke with patient; he would prefer to keep lab and phlebotomy on 3/27 as scheduled and see Dr Lindi Adie on 3/29. Appointment given to patient for MD visit at 31 on 3/29. Patient verbalized understanding.

## 2016-07-11 ENCOUNTER — Other Ambulatory Visit (HOSPITAL_BASED_OUTPATIENT_CLINIC_OR_DEPARTMENT_OTHER): Payer: 59

## 2016-07-11 ENCOUNTER — Ambulatory Visit (HOSPITAL_BASED_OUTPATIENT_CLINIC_OR_DEPARTMENT_OTHER): Payer: 59

## 2016-07-11 VITALS — BP 131/88 | HR 77 | Temp 98.4°F | Resp 17

## 2016-07-11 DIAGNOSIS — R7989 Other specified abnormal findings of blood chemistry: Secondary | ICD-10-CM

## 2016-07-11 LAB — CBC WITH DIFFERENTIAL/PLATELET
BASO%: 1.1 % (ref 0.0–2.0)
Basophils Absolute: 0 10*3/uL (ref 0.0–0.1)
EOS ABS: 0.2 10*3/uL (ref 0.0–0.5)
EOS%: 3.7 % (ref 0.0–7.0)
HCT: 44.9 % (ref 38.4–49.9)
HEMOGLOBIN: 15.7 g/dL (ref 13.0–17.1)
LYMPH%: 37.3 % (ref 14.0–49.0)
MCH: 33.7 pg — ABNORMAL HIGH (ref 27.2–33.4)
MCHC: 34.9 g/dL (ref 32.0–36.0)
MCV: 96.5 fL (ref 79.3–98.0)
MONO#: 0.4 10*3/uL (ref 0.1–0.9)
MONO%: 8.2 % (ref 0.0–14.0)
NEUT%: 49.7 % (ref 39.0–75.0)
NEUTROS ABS: 2.3 10*3/uL (ref 1.5–6.5)
PLATELETS: 167 10*3/uL (ref 140–400)
RBC: 4.65 10*6/uL (ref 4.20–5.82)
RDW: 12.1 % (ref 11.0–14.6)
WBC: 4.6 10*3/uL (ref 4.0–10.3)
lymph#: 1.7 10*3/uL (ref 0.9–3.3)

## 2016-07-11 LAB — FERRITIN: FERRITIN: 141 ng/mL (ref 22–316)

## 2016-07-11 LAB — IRON AND TIBC
%SAT: 40 % (ref 20–55)
IRON: 108 ug/dL (ref 42–163)
TIBC: 271 ug/dL (ref 202–409)
UIBC: 164 ug/dL (ref 117–376)

## 2016-07-11 NOTE — Progress Notes (Signed)
Phlebotomy.500 g blood obtained from patient with 16g PIV to Right AC. Procedure started at 0900 and ended at 0910. Patient given snack and encouraged fluids. Patient declined to stay for 30 minute observation period. Vital Signs stable.

## 2016-07-11 NOTE — Patient Instructions (Signed)
Therapeutic Phlebotomy Therapeutic phlebotomy is the controlled removal of blood from a person's body for the purpose of treating a medical condition. The procedure is similar to donating blood. Usually, about a pint (470 mL, or 0.47L) of blood is removed. The average adult has 9-12 pints (4.3-5.7 L) of blood. Therapeutic phlebotomy may be used to treat the following medical conditions:  Hemochromatosis. This is a condition in which the blood contains too much iron.  Polycythemia vera. This is a condition in which the blood contains too many red blood cells.  Porphyria cutanea tarda. This is a disease in which an important part of hemoglobin is not made properly. It results in the buildup of abnormal amounts of porphyrins in the body.  Sickle cell disease. This is a condition in which the red blood cells form an abnormal crescent shape rather than a round shape. Tell a health care provider about:  Any allergies you have.  All medicines you are taking, including vitamins, herbs, eye drops, creams, and over-the-counter medicines.  Any problems you or family members have had with anesthetic medicines.  Any blood disorders you have.  Any surgeries you have had.  Any medical conditions you have. What are the risks? Generally, this is a safe procedure. However, problems may occur, including:  Nausea or light-headedness.  Low blood pressure.  Soreness, bleeding, swelling, or bruising at the needle insertion site.  Infection. What happens before the procedure?  Follow instructions from your health care provider about eating or drinking restrictions.  Ask your health care provider about changing or stopping your regular medicines. This is especially important if you are taking diabetes medicines or blood thinners.  Wear clothing with sleeves that can be raised above the elbow.  Plan to have someone take you home after the procedure.  You may have a blood sample taken. What happens  during the procedure?  A needle will be inserted into one of your veins.  Tubing and a collection bag will be attached to that needle.  Blood will flow through the needle and tubing into the collection bag.  You may be asked to open and close your hand slowly and continually during the entire collection.  After the specified amount of blood has been removed from your body, the collection bag and tubing will be clamped.  The needle will be removed from your vein.  Pressure will be held on the site of the needle insertion to stop the bleeding.  A bandage (dressing) will be placed over the needle insertion site. The procedure may vary among health care providers and hospitals. What happens after the procedure?  Your recovery will be assessed and monitored.  You can return to your normal activities as directed by your health care provider. This information is not intended to replace advice given to you by your health care provider. Make sure you discuss any questions you have with your health care provider. Document Released: 09/05/2010 Document Revised: 12/04/2015 Document Reviewed: 03/30/2014 Elsevier Interactive Patient Education  2017 [employer]  

## 2016-07-13 ENCOUNTER — Ambulatory Visit (HOSPITAL_BASED_OUTPATIENT_CLINIC_OR_DEPARTMENT_OTHER): Payer: 59 | Admitting: Hematology and Oncology

## 2016-07-13 ENCOUNTER — Encounter: Payer: Self-pay | Admitting: Hematology and Oncology

## 2016-07-13 DIAGNOSIS — I700 Atherosclerosis of aorta: Secondary | ICD-10-CM

## 2016-07-13 DIAGNOSIS — C7A012 Malignant carcinoid tumor of the ileum: Secondary | ICD-10-CM

## 2016-07-13 DIAGNOSIS — K760 Fatty (change of) liver, not elsewhere classified: Secondary | ICD-10-CM | POA: Diagnosis not present

## 2016-07-13 NOTE — Progress Notes (Signed)
Patient Care Team: [place] as PCP - General (Family Medicine)  DIAGNOSIS:  Encounter Diagnosis  Name Primary?  . 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 He is completely asymptomatic. He tells me that he sold his house and is moving to [place]. This will be his last visit with [place]. He would like to continue his phlebotomy treatments closer to [place]. He recently had a phlebotomy and did very well with it.  REVIEW OF SYSTEMS:   Constitutional: Denies fevers, chills or abnormal weight loss Eyes: Denies blurriness of vision Ears, nose, mouth, throat, and face: Denies mucositis or sore throat Respiratory: Denies cough, dyspnea or wheezes Cardiovascular: Denies palpitation, chest discomfort Gastrointestinal:  Denies nausea, heartburn or change in bowel habits Skin: Denies abnormal skin rashes Lymphatics: Denies new lymphadenopathy or easy bruising Neurological:Denies numbness, tingling or new weaknesses Behavioral/Psych: Mood is stable, no new changes  Extremities: No lower extremity edema  All other systems were reviewed with the patient and are negative.  I have reviewed the past medical history, past surgical history, social history and family history with the patient and they are unchanged from previous note.  ALLERGIES:  has No Known Allergies.  MEDICATIONS:  Current Outpatient Prescriptions  Medication Sig Dispense Refill  . lisinopril (PRINIVIL,ZESTRIL) 10 MG tablet Take 10 mg by mouth daily.     . sildenafil (VIAGRA) 100 MG tablet Take 50 mg by mouth as needed for erectile dysfunction.      No current facility-administered medications for this visit.     PHYSICAL  EXAMINATION: ECOG PERFORMANCE STATUS: 1 - Symptomatic but completely ambulatory  Vitals:   07/13/16 1156  BP: 124/80  Pulse: 70  Resp: 18  Temp: 98.3 F (36.8 C)   Filed Weights   07/13/16 1156  Weight: 211 lb 4.8 oz (95.8 kg)    GENERAL:alert, no distress and comfortable SKIN: skin color, texture, turgor are normal, no rashes or significant lesions EYES: normal, Conjunctiva are pink and non-injected, sclera clear OROPHARYNX:no exudate, no erythema and lips, buccal mucosa, and tongue normal  NECK: supple, thyroid normal size, non-tender, without nodularity LYMPH:  no palpable lymphadenopathy in the cervical, axillary or inguinal LUNGS: clear to auscultation and percussion with normal breathing effort HEART: regular rate & rhythm and no murmurs and no lower extremity edema ABDOMEN:abdomen soft, non-tender and normal bowel sounds MUSCULOSKELETAL:no cyanosis of digits and no clubbing  NEURO: alert & oriented x 3 with fluent speech, no focal motor/sensory deficits EXTREMITIES: No lower extremity edema  LABORATORY DATA:  I have reviewed the data as listed   Chemistry      Component Value Date/Time   NA 140 09/21/2015 0940   K 4.8 09/21/2015 0940   CL 109 08/13/2015 0355   CO2 26 09/21/2015 0940   BUN 10.9 09/21/2015 0940   CREATININE 1.1 09/21/2015 0940      Component Value Date/Time   CALCIUM 9.5 09/21/2015 0940   ALKPHOS 68 09/21/2015 0940   AST 19 09/21/2015 0940   ALT 25 09/21/2015 0940   BILITOT 0.63 09/21/2015 0940       Lab Results  Component Value Date   WBC 4.6 07/11/2016   HGB 15.7 07/11/2016   HCT 44.9 07/11/2016   MCV 96.5 07/11/2016   PLT 167  07/11/2016   NEUTROABS 2.3 07/11/2016    ASSESSMENT & PLAN:  Hemochromatosis, 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 He started a cholesterol medication. Patient will be setting up with a new hematologist in [facility]. We will transfer his care once we know who he will be following   I spent 25 minutes talking to the patient of which more than half was spent in counseling and coordination of care.  No orders of the defined types were placed in this encounter.  The patient has a good understanding of the overall plan. he agrees with it. he will call with any problems that may develop before the next visit here.   Rulon Eisenmenger, MD 07/13/16

## 2016-07-13 NOTE — Assessment & Plan Note (Signed)
06/16/2015 revealed Serum iron 188, iron saturation 77%, ferritin 1683, AST 62, ALT 103 MRI Liver 06/24/2015: No MRI evidence of iron deposition within the liver, spleen, marrow. 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  Fatty liver: I instructed him to exercise frequently and to reduce his fat intake. He started a cholesterol medication. I will see him back in 3 months for follow-up.

## 2016-07-25 ENCOUNTER — Telehealth: Payer: Self-pay | Admitting: *Deleted

## 2016-07-25 NOTE — Telephone Encounter (Signed)
FYI "I need my medical records released to insurance company.  I am applying for a life insurance long term care policy.  I've moved to [place] and was told to call if I need records.  They need lab and his notes.  Can this information be e-mailed?  Mail records to me.  My current address is [address], [address], [place], [place]., [place].  I can be reached at [phone]."  Asked what records are needed.  "Lab and office notes"` are all he requested.  Will update demographics in EPIC.  Message sent to [facility].I.M.

## 2016-07-27 ENCOUNTER — Telehealth: Payer: Self-pay | Admitting: Hematology and Oncology

## 2016-07-27 NOTE — Telephone Encounter (Signed)
Per 07/25/16 telephone note.  Mailed pt records to [id number] [place]

## 2016-08-22 ENCOUNTER — Ambulatory Visit: Payer: 59 | Admitting: Hematology and Oncology

## 2016-08-22 ENCOUNTER — Other Ambulatory Visit: Payer: 59

## 2016-10-03 ENCOUNTER — Other Ambulatory Visit: Payer: 59

## 2016-11-14 ENCOUNTER — Ambulatory Visit: Payer: 59 | Admitting: Hematology and Oncology

## 2016-11-14 ENCOUNTER — Other Ambulatory Visit: Payer: 59

## 2017-04-19 IMAGING — CT CT ABD-PELV W/ CM
3 of 5 series · 13 of 36 positions shown, 19 images · IV contrast (READICAT/WATER & [ID] ISOVUE 300)
Comparison: MR abdomen 06/23/2015.

CLINICAL DATA: Right lower anterior abdominal wall bulge for 4
weeks. Question hernia.

EXAM:
CT ABDOMEN AND PELVIS WITH CONTRAST
TECHNIQUE: Multidetector CT imaging of the abdomen and pelvis was performed
using the standard protocol following bolus administration of
intravenous contrast.
CONTRAST:  125mL LJIH1V-111 IOPAMIDOL (LJIH1V-111) INJECTION 61%

[Series 3: abd/pelvis with · axial · 0.78mm/px · z∈[-306,+74]mm · 8 of 97 slices shown, 13 images]
[im 11/97  soft-tissue]
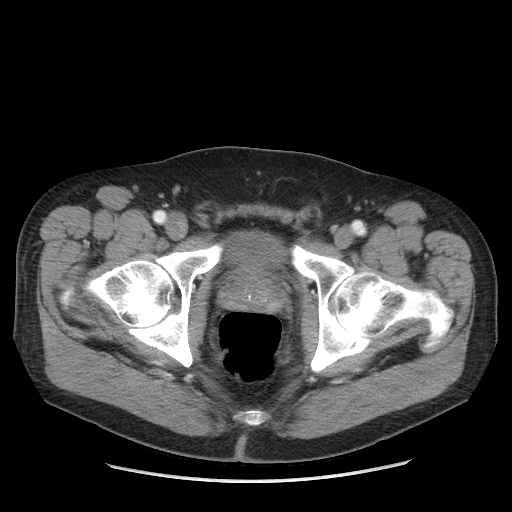
[im 11/97  bone]
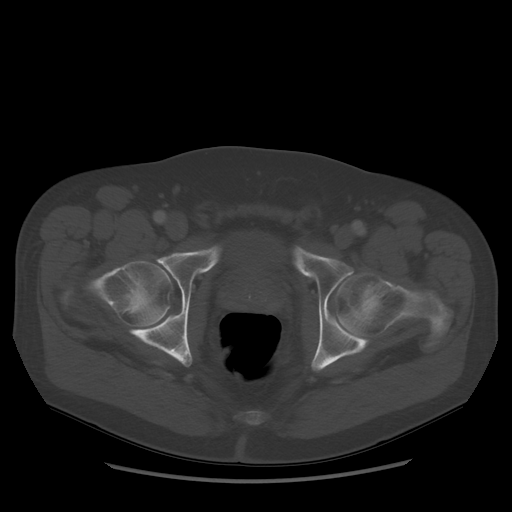
[im 22/97  soft-tissue]
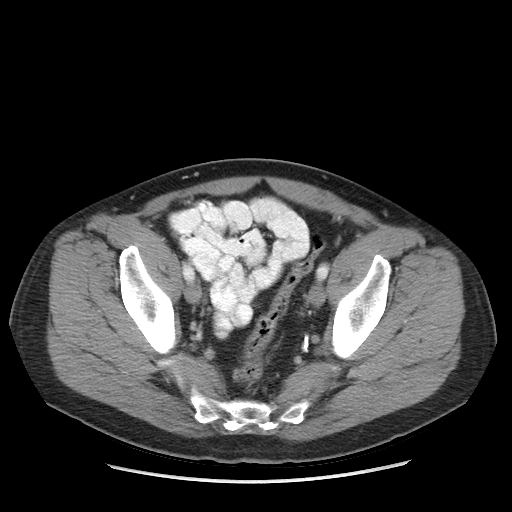
[im 33/97  soft-tissue]
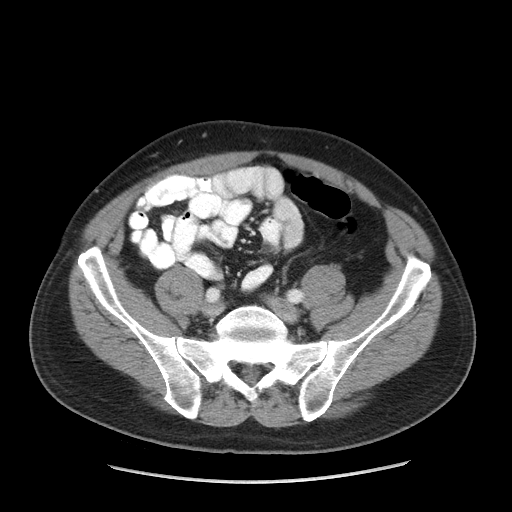
[im 43/97  soft-tissue]
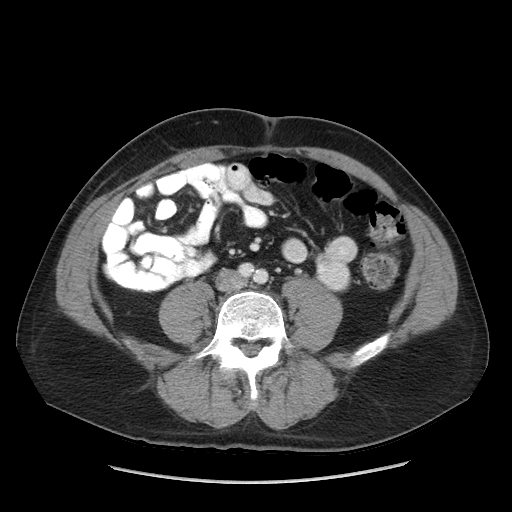
[im 54/97  soft-tissue]
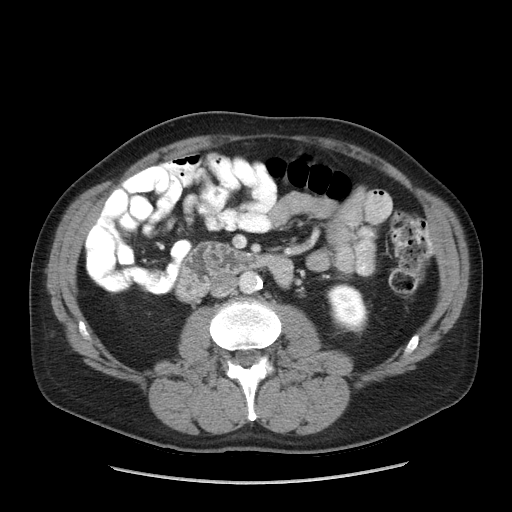
[im 54/97  lung]
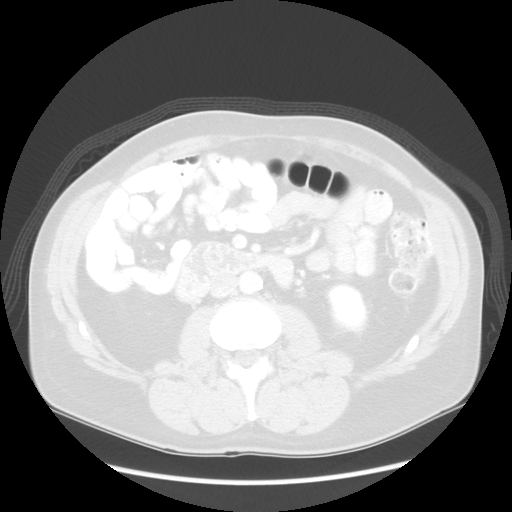
[im 65/97  soft-tissue]
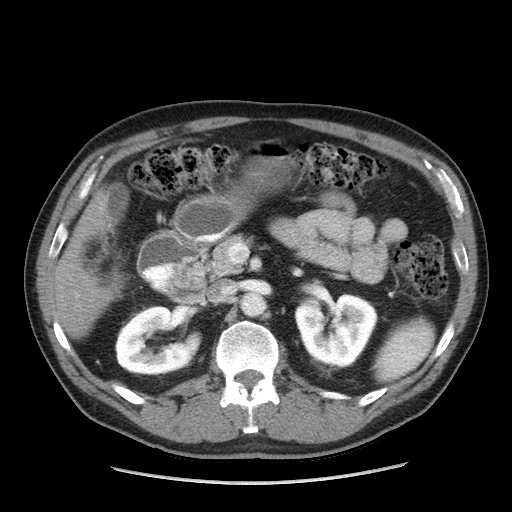
[im 65/97  lung]
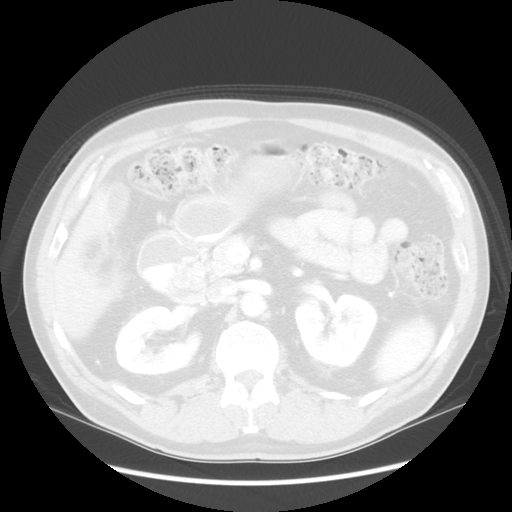
[im 75/97  soft-tissue]
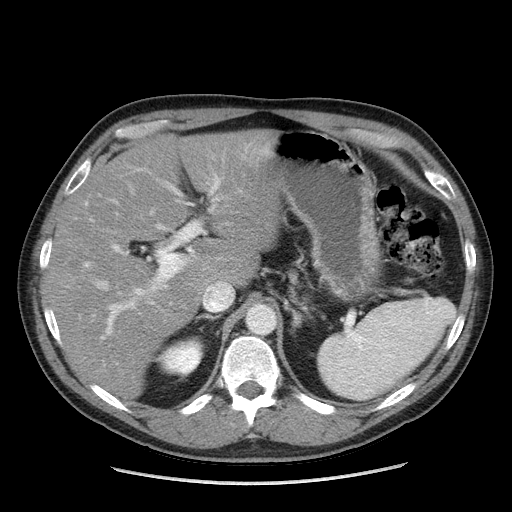
[im 75/97  lung]
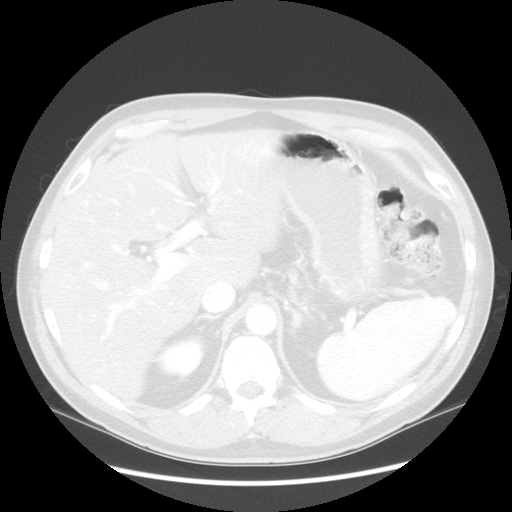
[im 86/97  soft-tissue]
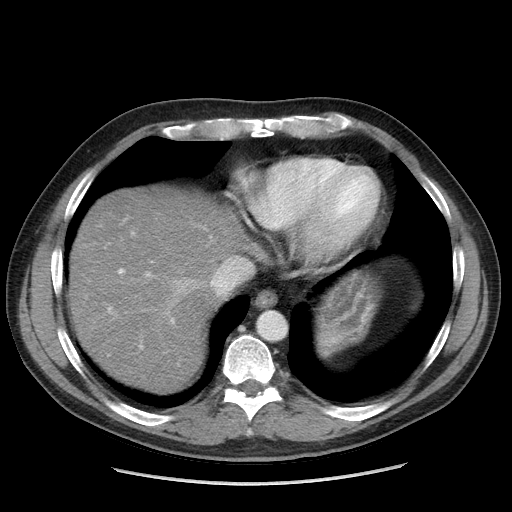
[im 86/97  lung]
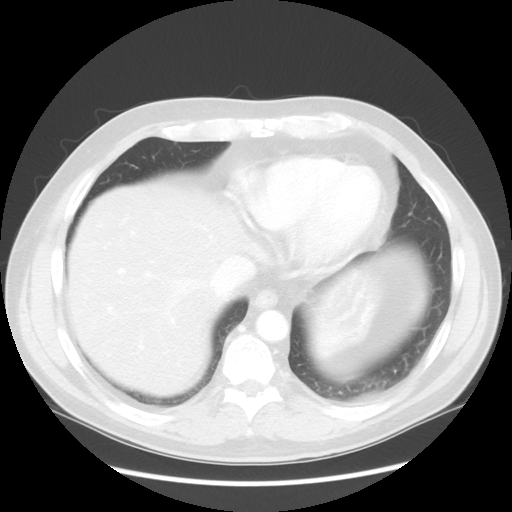

[Series 601: coronal body · coronal · 1.03mm/px · 1 of 119 slices shown, 2 images]
[im 40/119  soft-tissue]
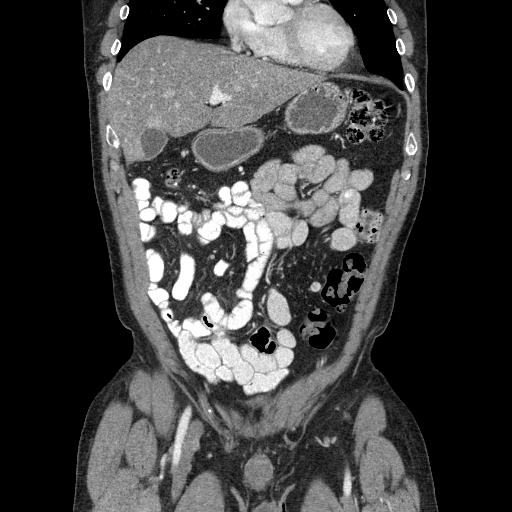
[im 40/119  bone]
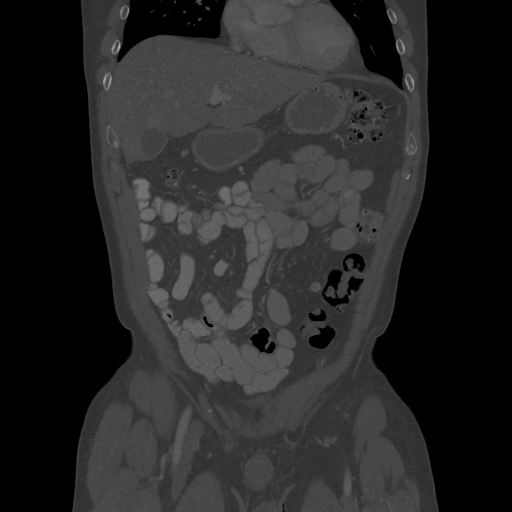

[Series 602: sagittal body · sagittal · 1.03mm/px · 4 of 161 slices shown]
[im 11/161  soft-tissue]
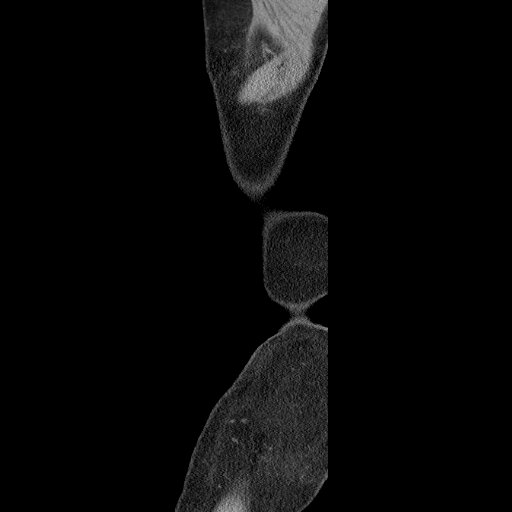
[im 31/161  soft-tissue]
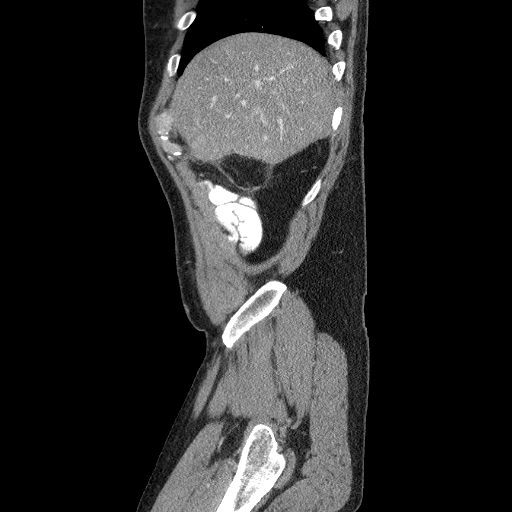
[im 51/161  soft-tissue]
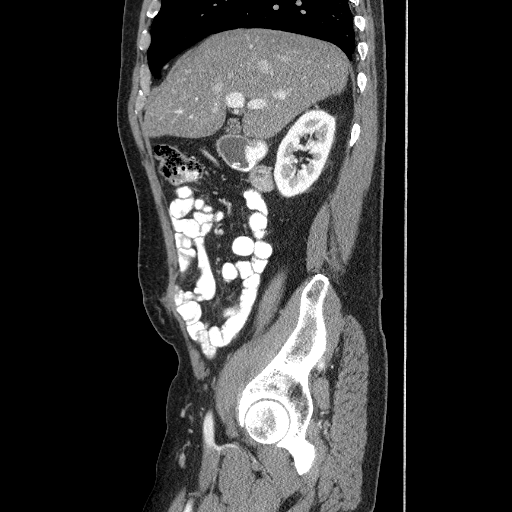
[im 71/161  soft-tissue]
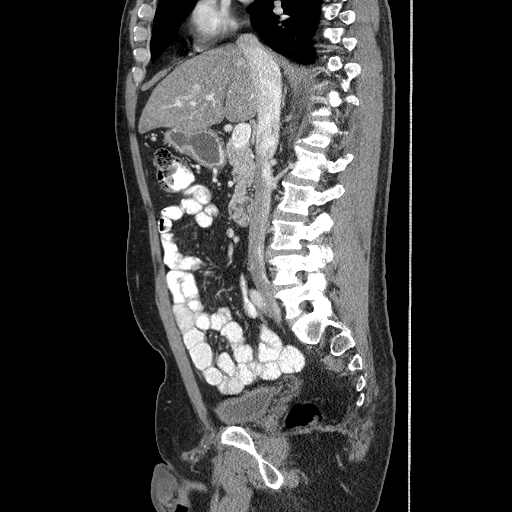

[13 of 36 positions shown; findings below may reference images not displayed]

FINDINGS: Lower chest: Lung bases show no acute findings. Heart size normal.
No pericardial or pleural effusion. Coronary artery calcification.

Hepatobiliary: Liver is decreased in attenuation diffusely with
sparing along the gallbladder fossa. Gallbladder is unremarkable. No
biliary ductal dilatation.

Pancreas: Negative.

Spleen: Negative.

Adrenals/Urinary Tract: Adrenal glands and kidneys are unremarkable.
Ureters are decompressed. Bladder is low in volume.

Stomach/Bowel: Stomach and small bowel are unremarkable. Right
hemicolectomy with fat necrosis in the right paracolic gutter. Colon
is otherwise unremarkable.

Vascular/Lymphatic: Atherosclerotic calcification of the arterial
vasculature without abdominal aortic aneurysm. No pathologically
enlarged lymph nodes.

Reproductive: Prostate is visualized.

Other: Mild subcutaneous scarring deep to a cutaneous marker along
the right ventral abdominal wall. No hernia. Small right inguinal
hernia contains fat. No free fluid. Mesenteries and peritoneum are
unremarkable.

Musculoskeletal: No worrisome lytic or sclerotic lesions.
IMPRESSION: 1. Subcutaneous scarring deep to a cutaneous marker along the
ventral right abdomen. No hernia.
2. Aortic atherosclerosis ([id number]-[id number]). Coronary artery
calcification.
3. Hepatic steatosis.
